# Patient Record
Sex: Male | Born: 1940 | Race: White | Hispanic: No | Marital: Married | Smoking: Current every day smoker
Health system: Southern US, Community
[De-identification: ages and names within clinical notes are randomized; demographics above are authoritative.]

## PROBLEM LIST (undated history)

## (undated) DIAGNOSIS — E119 Type 2 diabetes mellitus without complications: Secondary | ICD-10-CM

## (undated) DIAGNOSIS — H269 Unspecified cataract: Secondary | ICD-10-CM

## (undated) DIAGNOSIS — E079 Disorder of thyroid, unspecified: Secondary | ICD-10-CM

## (undated) DIAGNOSIS — T7840XA Allergy, unspecified, initial encounter: Secondary | ICD-10-CM

## (undated) HISTORY — PX: VASECTOMY: SHX75

## (undated) HISTORY — PX: EYE SURGERY: SHX253

## (undated) HISTORY — PX: JOINT REPLACEMENT: SHX530

## (undated) HISTORY — DX: Type 2 diabetes mellitus without complications: E11.9

## (undated) HISTORY — DX: Unspecified cataract: H26.9

## (undated) HISTORY — DX: Allergy, unspecified, initial encounter: T78.40XA

## (undated) HISTORY — DX: Disorder of thyroid, unspecified: E07.9

## (undated) HISTORY — PX: FRACTURE SURGERY: SHX138

---

## 1997-08-16 ENCOUNTER — Ambulatory Visit (HOSPITAL_COMMUNITY): Admission: RE | Admit: 1997-08-16 | Discharge: 1997-08-16 | Payer: Self-pay | Admitting: Endocrinology

## 1998-10-02 ENCOUNTER — Ambulatory Visit (HOSPITAL_COMMUNITY): Admission: RE | Admit: 1998-10-02 | Discharge: 1998-10-02 | Payer: Self-pay | Admitting: *Deleted

## 2000-07-22 ENCOUNTER — Encounter: Admission: RE | Admit: 2000-07-22 | Discharge: 2000-10-20 | Payer: Self-pay | Admitting: Endocrinology

## 2001-11-15 ENCOUNTER — Encounter (HOSPITAL_COMMUNITY): Admission: RE | Admit: 2001-11-15 | Discharge: 2002-02-13 | Payer: Self-pay | Admitting: Neurology

## 2001-12-15 ENCOUNTER — Ambulatory Visit (HOSPITAL_COMMUNITY): Admission: RE | Admit: 2001-12-15 | Discharge: 2001-12-15 | Payer: Self-pay | Admitting: Neurology

## 2002-02-14 ENCOUNTER — Encounter (HOSPITAL_COMMUNITY): Admission: RE | Admit: 2002-02-14 | Discharge: 2002-05-15 | Payer: Self-pay | Admitting: Neurology

## 2002-12-09 ENCOUNTER — Encounter: Payer: Self-pay | Admitting: Orthopedic Surgery

## 2002-12-14 ENCOUNTER — Encounter (INDEPENDENT_AMBULATORY_CARE_PROVIDER_SITE_OTHER): Payer: Self-pay | Admitting: *Deleted

## 2002-12-14 ENCOUNTER — Inpatient Hospital Stay (HOSPITAL_COMMUNITY): Admission: RE | Admit: 2002-12-14 | Discharge: 2002-12-18 | Payer: Self-pay | Admitting: Orthopedic Surgery

## 2002-12-14 ENCOUNTER — Encounter: Payer: Self-pay | Admitting: Orthopedic Surgery

## 2002-12-17 ENCOUNTER — Encounter: Payer: Self-pay | Admitting: Orthopedic Surgery

## 2003-09-06 ENCOUNTER — Encounter (INDEPENDENT_AMBULATORY_CARE_PROVIDER_SITE_OTHER): Payer: Self-pay | Admitting: Specialist

## 2003-09-06 ENCOUNTER — Ambulatory Visit (HOSPITAL_COMMUNITY): Admission: RE | Admit: 2003-09-06 | Discharge: 2003-09-06 | Payer: Self-pay | Admitting: *Deleted

## 2005-11-11 ENCOUNTER — Encounter: Admission: RE | Admit: 2005-11-11 | Discharge: 2005-11-11 | Payer: Self-pay | Admitting: Family Medicine

## 2006-03-02 ENCOUNTER — Encounter: Admission: RE | Admit: 2006-03-02 | Discharge: 2006-03-02 | Payer: Self-pay | Admitting: Endocrinology

## 2006-06-25 ENCOUNTER — Emergency Department (HOSPITAL_COMMUNITY): Admission: EM | Admit: 2006-06-25 | Discharge: 2006-06-26 | Payer: Self-pay | Admitting: Emergency Medicine

## 2006-08-31 ENCOUNTER — Encounter: Admission: RE | Admit: 2006-08-31 | Discharge: 2006-08-31 | Payer: Self-pay

## 2007-12-28 ENCOUNTER — Encounter: Admission: RE | Admit: 2007-12-28 | Discharge: 2007-12-28 | Payer: Self-pay | Admitting: Gastroenterology

## 2009-02-16 ENCOUNTER — Encounter: Admission: RE | Admit: 2009-02-16 | Discharge: 2009-02-16 | Payer: Self-pay | Admitting: Orthopedic Surgery

## 2009-04-03 ENCOUNTER — Inpatient Hospital Stay (HOSPITAL_COMMUNITY): Admission: RE | Admit: 2009-04-03 | Discharge: 2009-04-06 | Payer: Self-pay | Admitting: Orthopedic Surgery

## 2009-04-08 ENCOUNTER — Inpatient Hospital Stay (HOSPITAL_COMMUNITY): Admission: EM | Admit: 2009-04-08 | Discharge: 2009-04-09 | Payer: Self-pay | Admitting: Emergency Medicine

## 2010-01-03 DEATH — deceased

## 2010-05-25 ENCOUNTER — Encounter: Payer: Self-pay | Admitting: Endocrinology

## 2010-08-06 LAB — BASIC METABOLIC PANEL
BUN: 12 mg/dL (ref 6–23)
BUN: 17 mg/dL (ref 6–23)
BUN: 23 mg/dL (ref 6–23)
CO2: 28 mEq/L (ref 19–32)
Calcium: 8.2 mg/dL — ABNORMAL LOW (ref 8.4–10.5)
Calcium: 8.5 mg/dL (ref 8.4–10.5)
Chloride: 100 mEq/L (ref 96–112)
Chloride: 102 mEq/L (ref 96–112)
Chloride: 102 mEq/L (ref 96–112)
Creatinine, Ser: 1.01 mg/dL (ref 0.4–1.5)
Creatinine, Ser: 1.37 mg/dL (ref 0.4–1.5)
GFR calc Af Amer: >60 mL/min (ref >60)
GFR calc non Af Amer: 52 mL/min — ABNORMAL LOW (ref >60)
GFR calc non Af Amer: >60 mL/min (ref >60)
Glucose, Bld: 141 mg/dL — ABNORMAL HIGH (ref 70–99)
Glucose, Bld: 146 mg/dL — ABNORMAL HIGH (ref 70–99)
Potassium: 3.8 mEq/L (ref 3.5–5.1)
Sodium: 136 mEq/L (ref 135–145)
Sodium: 136 mEq/L (ref 135–145)

## 2010-08-06 LAB — CBC
HCT: 25.3 % — ABNORMAL LOW (ref 39.0–52.0)
HCT: 35.4 % — ABNORMAL LOW (ref 39.0–52.0)
Hemoglobin: 8.9 g/dL — ABNORMAL LOW (ref 13.0–17.0)
MCV: 94.1 fL (ref 78.0–100.0)
MCV: 94.2 fL (ref 78.0–100.0)
Platelets: 225 10*3/uL (ref 150–400)
RBC: 2.69 MIL/uL — ABNORMAL LOW (ref 4.22–5.81)
RBC: 3.76 MIL/uL — ABNORMAL LOW (ref 4.22–5.81)
RDW: 13.2 % (ref 11.5–15.5)
WBC: 6.6 10*3/uL (ref 4.0–10.5)

## 2010-08-06 LAB — GLUCOSE, CAPILLARY
Glucose-Capillary: 118 mg/dL — ABNORMAL HIGH (ref 70–99)
Glucose-Capillary: 121 mg/dL — ABNORMAL HIGH (ref 70–99)
Glucose-Capillary: 126 mg/dL — ABNORMAL HIGH (ref 70–99)
Glucose-Capillary: 134 mg/dL — ABNORMAL HIGH (ref 70–99)
Glucose-Capillary: 136 mg/dL — ABNORMAL HIGH (ref 70–99)
Glucose-Capillary: 147 mg/dL — ABNORMAL HIGH (ref 70–99)

## 2010-08-06 LAB — URINALYSIS, MICROSCOPIC ONLY
Ketones, ur: NEGATIVE mg/dL
Leukocytes, UA: NEGATIVE
Nitrite: NEGATIVE
Protein, ur: NEGATIVE mg/dL
pH: 6 (ref 5.0–8.0)

## 2010-08-07 LAB — COMPREHENSIVE METABOLIC PANEL
BUN: 16 mg/dL (ref 6–23)
CO2: 24 mEq/L (ref 19–32)
Chloride: 109 mEq/L (ref 96–112)
Creatinine, Ser: 1.01 mg/dL (ref 0.4–1.5)
GFR calc non Af Amer: >60 mL/min (ref >60)
Total Bilirubin: 0.6 mg/dL (ref 0.3–1.2)

## 2010-08-07 LAB — APTT: aPTT: 31 seconds (ref 24–37)

## 2010-08-07 LAB — GLUCOSE, CAPILLARY: Glucose-Capillary: 106 mg/dL — ABNORMAL HIGH (ref 70–99)

## 2010-08-07 LAB — CBC
HCT: 43.7 % (ref 39.0–52.0)
MCV: 93.8 fL (ref 78.0–100.0)
RBC: 4.66 MIL/uL (ref 4.22–5.81)
WBC: 7.7 10*3/uL (ref 4.0–10.5)

## 2010-08-07 LAB — DIFFERENTIAL
Basophils Absolute: 0 10*3/uL (ref 0.0–0.1)
Eosinophils Relative: 1 % (ref 0–5)
Lymphocytes Relative: 37 % (ref 12–46)
Neutro Abs: 4.3 10*3/uL (ref 1.7–7.7)

## 2010-08-07 LAB — PROTIME-INR: Prothrombin Time: 13.2 seconds (ref 11.6–15.2)

## 2010-09-20 NOTE — Discharge Summary (Signed)
   NAME:  Gabriel Evans, Gabriel Evans                         ACCOUNT NO.:  [id number]   MEDICAL RECORD NO.:  [id number]                   PATIENT TYPE:  INP   LOCATION:  5031                                 FACILITY:  [facility]   PHYSICIAN:  Nadara Mustard, M.D.                DATE OF BIRTH:  02-11-1941   DATE OF ADMISSION:  12/14/2002  DATE OF DISCHARGE:  12/18/2002                                 DISCHARGE SUMMARY   DISCHARGE DIAGNOSIS:  Avascular necrosis, right hip.   POSTOPERATIVE DIAGNOSIS:  Avascular necrosis, right hip.   PROCEDURE:  Right total hip arthroplasty.   DISPOSITION:  Discharged to home in stable condition with home health PT and  Coumadin for DVT prophylaxis. Follow up in the office in two weeks.   HISTORY OF PRESENT ILLNESS:  The patient is a 70 year old gentleman with  avascular necrosis collapse of the right hip. The patient has failed  conservative care and presented at this time for surgical intervention. The  patient's hospital course was essentially unremarkable. He underwent a right  total hip arthroplasty on August 11 with a #4 femur, 64-mm acetabulum, +0  neck, 36-mm head. The head was sent to pathology to ensure that this was  consistent with avascular necrosis. The patient was started on physical  therapy with progressive ambulation, weight bearing as tolerated. He was  placed on Keflex for infection prophylaxis and Coumadin for DVT prophylaxis.  The patient progressed well with physical therapy, and he was discharged to  home in stable condition on August 15 and will follow up in the office in  two weeks with home health PT.                                                  Nadara Mustard, M.D.    MVD/MEDQ  D:  01/24/2003  T:  01/25/2003  Job:  [id number]

## 2010-09-20 NOTE — H&P (Signed)
NAME:  Gabriel Evans, Gabriel Evans                         ACCOUNT NO.:  [id number]   MEDICAL RECORD NO.:  [id number]                   PATIENT TYPE:  INP   LOCATION:  2550                                 FACILITY:  [facility]   PHYSICIAN:  Nadara Mustard, M.D.                DATE OF BIRTH:  [birth year]   DATE OF ADMISSION:  12/14/2002  DATE OF DISCHARGE:                                HISTORY & PHYSICAL   HISTORY OF PRESENT ILLNESS:  The patient is a 70 year old gentleman who  developed bilateral hip pain right worse than left with MRI scan consistent  with avascular necrosis of the hips.  The patient states he is unable to  perform activities of daily living due to his hip pain. He has decreased  range of motion and presents at this time for a total hip arthroplasty.   ALLERGIES:  The patient states that PREDNISONE causes AGM, but otherwise no  other allergies.   MEDICATIONS:  Please see the attached medication sheet which included Mobic  15 mg p.o. q.a.m., Effexor XR 150 mg q.a.m. and 75 mg p.o. q.p.m., Avalide  150/12.5 p.o. q.a.m., Synthroid 10 mcg p.o. q.a.m., Glucovance 2.5/500 p.o.  q.a.m., Lactose 15 mg p.o. q.p.m., Ambien 10 mg p.o. q.h.s., and aspirin and  multivitamins.   PAST MEDICAL HISTORY:  Significant for vas deferens surgery in 1986, skull  fracture in 1962 for which he underwent tibial pin traction for a femur  fracture, surgery for mandible fracture, and a skull fracture.   SOCIAL HISTORY:  Does smoke pipes or cigars occasionally.  Alcohol  occasionally.  He is married.   FAMILY HISTORY:  Positive for hypertension and diabetes.   REVIEW OF SYSTEMS:  Positive for arthritis, skin cancer, type 2 diabetes,  and hypertension.   PHYSICAL EXAMINATION:  VITAL SIGNS: Temperature 96.8, pulse 82, respiratory  rate 20, blood pressure 120/66.  GENERAL: He is in no acute distress.  LUNGS:  Clear to auscultation.  HEART:  Regular rate and rhythm.  NECK:  Supple, no bruits.  EXTREMITIES:  Right lower extremity has internal rotation of 20 degrees,  external rotation of 45 degrees.   Radiograph shows avascular necrosis of both hips and MRI scan confirms  avascular necrosis.  The patient did have a previous femur fracture and  radiograph shows a distal healed femur fracture, but this is in the area of  the total hip arthroplasty.   ASSESSMENT:  Avascular necrosis of bilateral hips, right worse than left.    PLAN:  The patient wishes to proceed with total hip arthroplasty at this  time. The risks and benefits were discussed including infection, nerve  vascular injury, persistent pain, dislocation, DVT, and pulmonary embolism.  The patient understands and wishes to proceed at this time.  Nadara Mustard, M.D.    MVD/MEDQ  D:  12/14/2002  T:  12/14/2002  Job:  [id number]

## 2010-09-20 NOTE — Op Note (Signed)
NAME:  Gabriel Evans, Gabriel Evans                         ACCOUNT NO.:  [id number]   MEDICAL RECORD NO.:  [id number]                   PATIENT TYPE:  INP   LOCATION:  2550                                 FACILITY:  [facility]   PHYSICIAN:  Nadara Mustard, M.D.                DATE OF BIRTH:  06-12-1940   DATE OF PROCEDURE:  12/14/2002  DATE OF DISCHARGE:                                 OPERATIVE REPORT   PREOPERATIVE DIAGNOSIS:  Avascular necrosis, right hip.   POSTOPERATIVE DIAGNOSIS:  Avascular necrosis, right hip.   PROCEDURE:  Right total hip arthroplasty with ceramic on ceramic [employer]  components, femur #4, Accolade stem, acetabulum 64 mg, neck +0, head 36 mm.   SURGEON:  Nadara Mustard, M.D.   ANESTHESIA:  General endotracheal.   ESTIMATED BLOOD LOSS:  250 mL.   ANTIBIOTICS:  1 g Kefzol.   Head sent to pathology for evaluation for avascular necrosis.   The risks and benefits were discussed.  The patient states he understands  and wishes to proceed at this time.   DESCRIPTION OF PROCEDURE:  The patient was brought to OR room 5 and  underwent a general anesthetic.  After an adequate level of anesthesia  obtained, the patient was placed in the left lateral decubitus position with  the right side up and his right lower extremity was prepped using Duraprep,  draped into a sterile field, and an [place] was used to cover all exposed  skin.  A posterolateral incision was made.  This was carried down to the  tensor fascia lata.  A Charnley retractor was placed.  The piriformis and  short external rotators were tagged, cut, and retracted.  The capsule was  teed, tagged, and retracted.  The hip was dislocated and the femoral neck  cut was made 1 cm proximal to the calcar.  Attention was first focused on  the acetabulum.  The acetabulum reamed to a 60 mm and the 60 mm acetabulum  was trialled, and this had a good, stable fit.  The final acetabular  prosthesis was then inserted and due to  fractures of the bone spurs, this no  longer had a stable fit.  This was then removed, the bone spurs were  removed, and this was then reamed to a 64 mm.  Again this was tried and had  a good stable fit with a trial, and the 64 mm acetabulum was then inserted.  The trial liner was inserted.  The femur was then sequentially broached to a  #4 Accolade stem.  This was trialled with a trial head and trial liner, and  the patient had full flexion of 100 degrees, had full adduction with  internal rotation of 45 degrees, and the hip was stable.  The patient also  had full external rotation and extension.  The trial components were  removed.  The acetabulum liner was removed, the centralizer  spacer was  placed.  This was again irrigated, and the ceramic acetabular liner was  placed.  The Accolade stem #4 was then inserted and the 36 mm head was  attached.  This was reduced.  Again the hip was placed through a full range  of motion and had no instability.  The wounds were irrigated, the  instruments were removed.  The piriformis, capsule, and short external  rotators were reapproximated.  The tensor fascia lata was closed using #1  Vicryl, the deep subcu  and hip flexure were closed using 2-0 Vicryl, the skin was closed using  Proximate staples.  The wound was covered with Adaptic, orthopedic sponges,  and Ioban tape.  The patient was transferred supine, a knee immobilizer was  placed.  He was extubated and taken to PACU in stable condition.                                               Nadara Mustard, M.D.    MVD/MEDQ  D:  12/14/2002  T:  12/14/2002  Job:  [id number]

## 2010-09-20 NOTE — Op Note (Signed)
NAME:  Gabriel Evans, Gabriel Evans                         ACCOUNT NO.:  [id number]   MEDICAL RECORD NO.:  [id number]                   PATIENT TYPE:  AMB   LOCATION:  ENDO                                 [facility]ACILITY[facility]  [facility]   PHYSICIAN:  Georgiana Spinner, M.D.                 DATE OF BIRTH:  1940-12-14   DATE OF PROCEDURE:  09/06/2003  DATE OF DISCHARGE:                                 OPERATIVE REPORT   PROCEDURE:  Colonoscopy with biopsy.   INDICATIONS:  Colon polyp.   ANESTHESIA:  1. Demerol 70 mg.  2. Versed 7 mg.   DESCRIPTION OF PROCEDURE:  With patient mildly sedated in the left lateral  decubitus position, the Olympus videoscopic colonoscope was inserted into  the rectum and passed under direct vision to the cecum, identified by  ileocecal valve and appendiceal orifice, both of which were photographed.  In the cecum was a polyp that was also photographed and removed using hot  biopsy forceps technique, setting of 20-20 blended current.  The endoscope  was then withdrawn, taking circumferential views of the remaining colonic  mucosa, stopping only in the rectum which appeared normal in direct and  retroflexed view.  The endoscope was straightened, withdrawn.  The patient's  vital signs, pulse oximeter remained stable.  The patient tolerated the  procedure well without apparent complications.   FINDINGS:  Colon polyp of cecum.  Await biopsy report.  The patient will  call me for results and follow up with me as an outpatient.                                               Georgiana Spinner, M.D.    GMO/MEDQ  D:  09/06/2003  T:  09/06/2003  Job:  [id number]

## 2011-05-14 DIAGNOSIS — D485 Neoplasm of uncertain behavior of skin: Secondary | ICD-10-CM | POA: Diagnosis not present

## 2011-05-14 DIAGNOSIS — L819 Disorder of pigmentation, unspecified: Secondary | ICD-10-CM | POA: Diagnosis not present

## 2011-05-14 DIAGNOSIS — R238 Other skin changes: Secondary | ICD-10-CM | POA: Diagnosis not present

## 2011-05-14 DIAGNOSIS — L905 Scar conditions and fibrosis of skin: Secondary | ICD-10-CM | POA: Diagnosis not present

## 2011-05-14 DIAGNOSIS — L570 Actinic keratosis: Secondary | ICD-10-CM | POA: Diagnosis not present

## 2011-07-09 DIAGNOSIS — E789 Disorder of lipoprotein metabolism, unspecified: Secondary | ICD-10-CM | POA: Diagnosis not present

## 2011-07-09 DIAGNOSIS — E119 Type 2 diabetes mellitus without complications: Secondary | ICD-10-CM | POA: Diagnosis not present

## 2011-07-16 DIAGNOSIS — E0789 Other specified disorders of thyroid: Secondary | ICD-10-CM | POA: Diagnosis not present

## 2011-07-16 DIAGNOSIS — E559 Vitamin D deficiency, unspecified: Secondary | ICD-10-CM | POA: Diagnosis not present

## 2011-07-16 DIAGNOSIS — E789 Disorder of lipoprotein metabolism, unspecified: Secondary | ICD-10-CM | POA: Diagnosis not present

## 2011-07-16 DIAGNOSIS — E119 Type 2 diabetes mellitus without complications: Secondary | ICD-10-CM | POA: Diagnosis not present

## 2011-07-16 DIAGNOSIS — N059 Unspecified nephritic syndrome with unspecified morphologic changes: Secondary | ICD-10-CM | POA: Diagnosis not present

## 2011-08-11 DIAGNOSIS — D485 Neoplasm of uncertain behavior of skin: Secondary | ICD-10-CM | POA: Diagnosis not present

## 2011-08-11 DIAGNOSIS — L570 Actinic keratosis: Secondary | ICD-10-CM | POA: Diagnosis not present

## 2011-11-26 ENCOUNTER — Ambulatory Visit: Payer: Medicare Other

## 2011-11-26 ENCOUNTER — Ambulatory Visit (INDEPENDENT_AMBULATORY_CARE_PROVIDER_SITE_OTHER): Payer: Medicare Other | Admitting: Family Medicine

## 2011-11-26 VITALS — BP 122/64 | HR 76 | Temp 97.4°F | Resp 16 | Ht 70.0 in | Wt 198.0 lb

## 2011-11-26 DIAGNOSIS — R0781 Pleurodynia: Secondary | ICD-10-CM

## 2011-11-26 DIAGNOSIS — R079 Chest pain, unspecified: Secondary | ICD-10-CM
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
## 2011-12-01 DIAGNOSIS — S2239XA Fracture of one rib, unspecified side, initial encounter for closed fracture: Secondary | ICD-10-CM | POA: Diagnosis not present

## 2011-12-09 DIAGNOSIS — L570 Actinic keratosis: Secondary | ICD-10-CM | POA: Diagnosis not present

## 2012-01-08 DIAGNOSIS — E0789 Other specified disorders of thyroid: Secondary | ICD-10-CM | POA: Diagnosis not present

## 2012-01-08 DIAGNOSIS — E789 Disorder of lipoprotein metabolism, unspecified: Secondary | ICD-10-CM | POA: Diagnosis not present

## 2012-01-08 DIAGNOSIS — E119 Type 2 diabetes mellitus without complications: Secondary | ICD-10-CM | POA: Diagnosis not present

## 2012-01-08 DIAGNOSIS — Z125 Encounter for screening for malignant neoplasm of prostate: Secondary | ICD-10-CM | POA: Diagnosis not present

## 2012-01-14 DIAGNOSIS — E119 Type 2 diabetes mellitus without complications: Secondary | ICD-10-CM | POA: Diagnosis not present

## 2012-01-14 DIAGNOSIS — R079 Chest pain, unspecified: Secondary | ICD-10-CM | POA: Diagnosis not present

## 2012-01-14 DIAGNOSIS — G589 Mononeuropathy, unspecified: Secondary | ICD-10-CM | POA: Diagnosis not present

## 2012-01-14 DIAGNOSIS — E789 Disorder of lipoprotein metabolism, unspecified: Secondary | ICD-10-CM | POA: Diagnosis not present

## 2012-02-06 DIAGNOSIS — Z23 Encounter for immunization: Secondary | ICD-10-CM | POA: Diagnosis not present

## 2012-03-04 ENCOUNTER — Other Ambulatory Visit: Payer: Self-pay | Admitting: Gastroenterology

## 2012-03-04 DIAGNOSIS — D129 Benign neoplasm of anus and anal canal: Secondary | ICD-10-CM | POA: Diagnosis not present

## 2012-03-04 DIAGNOSIS — K648 Other hemorrhoids: Secondary | ICD-10-CM | POA: Diagnosis not present

## 2012-03-04 DIAGNOSIS — Z86010 Personal history of colonic polyps: Secondary | ICD-10-CM | POA: Diagnosis not present

## 2012-03-04 DIAGNOSIS — Z09 Encounter for follow-up examination after completed treatment for conditions other than malignant neoplasm: Secondary | ICD-10-CM | POA: Diagnosis not present

## 2012-04-05 DIAGNOSIS — D239 Other benign neoplasm of skin, unspecified: Secondary | ICD-10-CM | POA: Diagnosis not present

## 2012-04-05 DIAGNOSIS — L570 Actinic keratosis: Secondary | ICD-10-CM | POA: Diagnosis not present

## 2012-04-05 DIAGNOSIS — Z85828 Personal history of other malignant neoplasm of skin: Secondary | ICD-10-CM | POA: Diagnosis not present

## 2012-04-05 DIAGNOSIS — L821 Other seborrheic keratosis: Secondary | ICD-10-CM | POA: Diagnosis not present

## 2012-04-08 DIAGNOSIS — E119 Type 2 diabetes mellitus without complications: Secondary | ICD-10-CM | POA: Diagnosis not present

## 2012-04-08 DIAGNOSIS — H04129 Dry eye syndrome of unspecified lacrimal gland: Secondary | ICD-10-CM | POA: Diagnosis not present

## 2012-04-08 DIAGNOSIS — H25019 Cortical age-related cataract, unspecified eye: Secondary | ICD-10-CM | POA: Diagnosis not present

## 2012-04-08 DIAGNOSIS — Z961 Presence of intraocular lens: Secondary | ICD-10-CM | POA: Diagnosis not present

## 2012-07-07 DIAGNOSIS — E789 Disorder of lipoprotein metabolism, unspecified: Secondary | ICD-10-CM | POA: Diagnosis not present

## 2012-07-07 DIAGNOSIS — E119 Type 2 diabetes mellitus without complications: Secondary | ICD-10-CM | POA: Diagnosis not present

## 2012-07-14 DIAGNOSIS — Z79899 Other long term (current) drug therapy: Secondary | ICD-10-CM | POA: Diagnosis not present

## 2012-07-14 DIAGNOSIS — E119 Type 2 diabetes mellitus without complications: Secondary | ICD-10-CM | POA: Diagnosis not present

## 2012-07-14 DIAGNOSIS — R002 Palpitations: Secondary | ICD-10-CM | POA: Diagnosis not present

## 2012-07-14 DIAGNOSIS — G589 Mononeuropathy, unspecified: Secondary | ICD-10-CM | POA: Diagnosis not present

## 2012-07-14 DIAGNOSIS — R279 Unspecified lack of coordination: Secondary | ICD-10-CM | POA: Diagnosis not present

## 2012-08-03 DIAGNOSIS — L259 Unspecified contact dermatitis, unspecified cause: Secondary | ICD-10-CM | POA: Diagnosis not present

## 2012-08-03 DIAGNOSIS — L570 Actinic keratosis: Secondary | ICD-10-CM | POA: Diagnosis not present

## 2012-08-03 DIAGNOSIS — Z85828 Personal history of other malignant neoplasm of skin: Secondary | ICD-10-CM | POA: Diagnosis not present

## 2012-11-08 DIAGNOSIS — E789 Disorder of lipoprotein metabolism, unspecified: Secondary | ICD-10-CM | POA: Diagnosis not present

## 2012-11-08 DIAGNOSIS — E0789 Other specified disorders of thyroid: Secondary | ICD-10-CM | POA: Diagnosis not present

## 2012-11-08 DIAGNOSIS — G589 Mononeuropathy, unspecified: Secondary | ICD-10-CM | POA: Diagnosis not present

## 2012-11-08 DIAGNOSIS — E119 Type 2 diabetes mellitus without complications: Secondary | ICD-10-CM | POA: Diagnosis not present

## 2013-01-07 DIAGNOSIS — H2589 Other age-related cataract: Secondary | ICD-10-CM | POA: Diagnosis not present

## 2013-01-07 DIAGNOSIS — E119 Type 2 diabetes mellitus without complications: Secondary | ICD-10-CM | POA: Diagnosis not present

## 2013-01-11 DIAGNOSIS — Z125 Encounter for screening for malignant neoplasm of prostate: Secondary | ICD-10-CM | POA: Diagnosis not present

## 2013-01-11 DIAGNOSIS — E119 Type 2 diabetes mellitus without complications: Secondary | ICD-10-CM | POA: Diagnosis not present

## 2013-01-11 DIAGNOSIS — E789 Disorder of lipoprotein metabolism, unspecified: Secondary | ICD-10-CM | POA: Diagnosis not present

## 2013-01-11 DIAGNOSIS — E0789 Other specified disorders of thyroid: Secondary | ICD-10-CM | POA: Diagnosis not present

## 2013-01-11 DIAGNOSIS — Z79899 Other long term (current) drug therapy: Secondary | ICD-10-CM | POA: Diagnosis not present

## 2013-01-18 DIAGNOSIS — D649 Anemia, unspecified: Secondary | ICD-10-CM | POA: Diagnosis not present

## 2013-01-18 DIAGNOSIS — E0789 Other specified disorders of thyroid: Secondary | ICD-10-CM | POA: Diagnosis not present

## 2013-01-18 DIAGNOSIS — E789 Disorder of lipoprotein metabolism, unspecified: Secondary | ICD-10-CM | POA: Diagnosis not present

## 2013-01-18 DIAGNOSIS — E119 Type 2 diabetes mellitus without complications: Secondary | ICD-10-CM | POA: Diagnosis not present

## 2013-03-22 DIAGNOSIS — E119 Type 2 diabetes mellitus without complications: Secondary | ICD-10-CM | POA: Diagnosis not present

## 2013-03-22 DIAGNOSIS — D649 Anemia, unspecified: Secondary | ICD-10-CM | POA: Diagnosis not present

## 2013-03-22 DIAGNOSIS — H53149 Visual discomfort, unspecified: Secondary | ICD-10-CM | POA: Diagnosis not present

## 2013-03-22 DIAGNOSIS — H25019 Cortical age-related cataract, unspecified eye: Secondary | ICD-10-CM | POA: Diagnosis not present

## 2013-04-25 DIAGNOSIS — L570 Actinic keratosis: Secondary | ICD-10-CM | POA: Diagnosis not present

## 2013-04-25 DIAGNOSIS — D239 Other benign neoplasm of skin, unspecified: Secondary | ICD-10-CM | POA: Diagnosis not present

## 2013-04-25 DIAGNOSIS — L821 Other seborrheic keratosis: Secondary | ICD-10-CM | POA: Diagnosis not present

## 2013-04-25 DIAGNOSIS — Z85828 Personal history of other malignant neoplasm of skin: Secondary | ICD-10-CM | POA: Diagnosis not present

## 2013-05-17 DIAGNOSIS — Z23 Encounter for immunization: Secondary | ICD-10-CM | POA: Diagnosis not present

## 2013-07-19 DIAGNOSIS — E119 Type 2 diabetes mellitus without complications: Secondary | ICD-10-CM | POA: Diagnosis not present

## 2013-07-19 DIAGNOSIS — E789 Disorder of lipoprotein metabolism, unspecified: Secondary | ICD-10-CM | POA: Diagnosis not present

## 2013-07-19 DIAGNOSIS — D649 Anemia, unspecified: Secondary | ICD-10-CM | POA: Diagnosis not present

## 2013-07-19 DIAGNOSIS — E0789 Other specified disorders of thyroid: Secondary | ICD-10-CM | POA: Diagnosis not present

## 2013-07-26 DIAGNOSIS — E119 Type 2 diabetes mellitus without complications: Secondary | ICD-10-CM | POA: Diagnosis not present

## 2013-07-26 DIAGNOSIS — E789 Disorder of lipoprotein metabolism, unspecified: Secondary | ICD-10-CM | POA: Diagnosis not present

## 2013-07-26 DIAGNOSIS — G589 Mononeuropathy, unspecified: Secondary | ICD-10-CM | POA: Diagnosis not present

## 2013-07-26 DIAGNOSIS — E0789 Other specified disorders of thyroid: Secondary | ICD-10-CM | POA: Diagnosis not present

## 2013-08-09 DIAGNOSIS — M25559 Pain in unspecified hip: Secondary | ICD-10-CM | POA: Diagnosis not present

## 2013-08-10 DIAGNOSIS — M25559 Pain in unspecified hip: Secondary | ICD-10-CM | POA: Diagnosis not present

## 2013-08-16 DIAGNOSIS — M25559 Pain in unspecified hip: Secondary | ICD-10-CM | POA: Diagnosis not present

## 2013-08-18 DIAGNOSIS — M25559 Pain in unspecified hip: Secondary | ICD-10-CM | POA: Diagnosis not present

## 2013-08-23 DIAGNOSIS — M25559 Pain in unspecified hip: Secondary | ICD-10-CM | POA: Diagnosis not present

## 2013-08-26 DIAGNOSIS — M25559 Pain in unspecified hip: Secondary | ICD-10-CM | POA: Diagnosis not present

## 2013-08-30 DIAGNOSIS — M25559 Pain in unspecified hip: Secondary | ICD-10-CM | POA: Diagnosis not present

## 2013-08-31 ENCOUNTER — Ambulatory Visit (INDEPENDENT_AMBULATORY_CARE_PROVIDER_SITE_OTHER): Payer: Medicare Other | Admitting: Family Medicine

## 2013-08-31 VITALS — BP 138/74 | HR 79 | Temp 97.5°F | Resp 16 | Ht 69.75 in | Wt 201.0 lb

## 2013-08-31 DIAGNOSIS — J301 Allergic rhinitis due to pollen: Secondary | ICD-10-CM

## 2013-08-31 DIAGNOSIS — J0100 Acute maxillary sinusitis, unspecified: Secondary | ICD-10-CM | POA: Diagnosis not present

## 2013-08-31 MED ORDER — IPRATROPIUM BROMIDE 0.03 % NA SOLN: 2.0000 | Freq: Two times a day (BID) | NASAL | Status: AC

## 2013-08-31 MED ORDER — AMOXICILLIN-POT CLAVULANATE 875-125 MG PO TABS
1.0000 | ORAL_TABLET | Freq: Two times a day (BID) | ORAL | Status: DC
Start: 2013-08-31 — End: 2013-10-01
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
## 2013-08-31 NOTE — Patient Instructions (Signed)
1. Continue Zyrtec daily. 2.  Take Mucinex twice daily.  Sinusitis Sinusitis is redness, soreness, and swelling (inflammation) of the paranasal sinuses. Paranasal sinuses are air pockets within the bones of your face (beneath the eyes, the middle of the forehead, or above the eyes). In healthy paranasal sinuses, mucus is able to drain out, and air is able to circulate through them by way of your nose. However, when your paranasal sinuses are inflamed, mucus and air can become trapped. This can allow bacteria and other germs to grow and cause infection. Sinusitis can develop quickly and last only a short time (acute) or continue over a long period (chronic). Sinusitis that lasts for more than 12 weeks is considered chronic.  CAUSES  Causes of sinusitis include:  Allergies.  Structural abnormalities, such as displacement of the cartilage that separates your nostrils (deviated septum), which can decrease the air flow through your nose and sinuses and affect sinus drainage.  Functional abnormalities, such as when the small hairs (cilia) that line your sinuses and help remove mucus do not work properly or are not present. SYMPTOMS  Symptoms of acute and chronic sinusitis are the same. The primary symptoms are pain and pressure around the affected sinuses. Other symptoms include:  Upper toothache.  Earache.  Headache.  Bad breath.  Decreased sense of smell and taste.  A cough, which worsens when you are lying flat.  Fatigue.  Fever.  Thick drainage from your nose, which often is green and may contain pus (purulent).  Swelling and warmth over the affected sinuses. DIAGNOSIS  Your caregiver will perform a physical exam. During the exam, your caregiver may:  Look in your nose for signs of abnormal growths in your nostrils (nasal polyps).  Tap over the affected sinus to check for signs of infection.  View the inside of your sinuses (endoscopy) with a special imaging device with a  light attached (endoscope), which is inserted into your sinuses. If your caregiver suspects that you have chronic sinusitis, one or more of the following tests may be recommended:  Allergy tests.  Nasal culture A sample of mucus is taken from your nose and sent to a lab and screened for bacteria.  Nasal cytology A sample of mucus is taken from your nose and examined by your caregiver to determine if your sinusitis is related to an allergy. TREATMENT  Most cases of acute sinusitis are related to a viral infection and will resolve on their own within 10 days. Sometimes medicines are prescribed to help relieve symptoms (pain medicine, decongestants, nasal steroid sprays, or saline sprays).  However, for sinusitis related to a bacterial infection, your caregiver will prescribe antibiotic medicines. These are medicines that will help kill the bacteria causing the infection.  Rarely, sinusitis is caused by a fungal infection. In theses cases, your caregiver will prescribe antifungal medicine. For some cases of chronic sinusitis, surgery is needed. Generally, these are cases in which sinusitis recurs more than 3 times per year, despite other treatments. HOME CARE INSTRUCTIONS   Drink plenty of water. Water helps thin the mucus so your sinuses can drain more easily.  Use a humidifier.  Inhale steam 3 to 4 times a day (for example, sit in the bathroom with the shower running).  Apply a warm, moist washcloth to your face 3 to 4 times a day, or as directed by your caregiver.  Use saline nasal sprays to help moisten and clean your sinuses.  Take over-the-counter or prescription medicines for pain, discomfort,  or fever only as directed by your caregiver. SEEK IMMEDIATE MEDICAL CARE IF:  You have increasing pain or severe headaches.  You have nausea, vomiting, or drowsiness.  You have swelling around your face.  You have vision problems.  You have a stiff neck.  You have difficulty  breathing. MAKE SURE YOU:   Understand these instructions.  Will watch your condition.  Will get help right away if you are not doing well or get worse. Document Released: 04/21/2005 Document Revised: 07/14/2011 Document Reviewed: 05/06/2011 [facility] Patient Information 2014 [place], [place].

## 2013-09-01 DIAGNOSIS — M25559 Pain in unspecified hip: Secondary | ICD-10-CM | POA: Diagnosis not present

## 2013-09-06 DIAGNOSIS — M25559 Pain in unspecified hip: Secondary | ICD-10-CM | POA: Diagnosis not present

## 2013-09-08 DIAGNOSIS — M25559 Pain in unspecified hip: Secondary | ICD-10-CM | POA: Diagnosis not present

## 2013-09-15 DIAGNOSIS — R262 Difficulty in walking, not elsewhere classified: Secondary | ICD-10-CM | POA: Diagnosis not present

## 2013-09-15 DIAGNOSIS — M25559 Pain in unspecified hip: Secondary | ICD-10-CM | POA: Diagnosis not present

## 2013-09-16 DIAGNOSIS — M25559 Pain in unspecified hip: Secondary | ICD-10-CM | POA: Diagnosis not present

## 2013-09-16 DIAGNOSIS — R262 Difficulty in walking, not elsewhere classified: Secondary | ICD-10-CM | POA: Diagnosis not present

## 2013-09-19 DIAGNOSIS — H2510 Age-related nuclear cataract, unspecified eye: Secondary | ICD-10-CM | POA: Diagnosis not present

## 2013-09-19 DIAGNOSIS — M25559 Pain in unspecified hip: Secondary | ICD-10-CM | POA: Diagnosis not present

## 2013-09-19 DIAGNOSIS — R262 Difficulty in walking, not elsewhere classified: Secondary | ICD-10-CM | POA: Diagnosis not present

## 2013-09-19 DIAGNOSIS — E119 Type 2 diabetes mellitus without complications: Secondary | ICD-10-CM | POA: Diagnosis not present

## 2013-09-21 DIAGNOSIS — R262 Difficulty in walking, not elsewhere classified: Secondary | ICD-10-CM | POA: Diagnosis not present

## 2013-09-21 DIAGNOSIS — M25559 Pain in unspecified hip: Secondary | ICD-10-CM | POA: Diagnosis not present

## 2013-09-27 DIAGNOSIS — R262 Difficulty in walking, not elsewhere classified: Secondary | ICD-10-CM | POA: Diagnosis not present

## 2013-09-27 DIAGNOSIS — M25559 Pain in unspecified hip: Secondary | ICD-10-CM | POA: Diagnosis not present

## 2013-09-28 DIAGNOSIS — M25559 Pain in unspecified hip: Secondary | ICD-10-CM | POA: Diagnosis not present

## 2013-09-28 DIAGNOSIS — M5137 Other intervertebral disc degeneration, lumbosacral region: Secondary | ICD-10-CM | POA: Diagnosis not present

## 2013-09-29 DIAGNOSIS — M25559 Pain in unspecified hip: Secondary | ICD-10-CM | POA: Diagnosis not present

## 2013-09-29 DIAGNOSIS — R262 Difficulty in walking, not elsewhere classified: Secondary | ICD-10-CM | POA: Diagnosis not present

## 2013-10-01 ENCOUNTER — Ambulatory Visit (INDEPENDENT_AMBULATORY_CARE_PROVIDER_SITE_OTHER): Payer: Medicare Other | Admitting: Family Medicine

## 2013-10-01 VITALS — BP 138/72 | HR 88 | Temp 97.9°F | Resp 16 | Ht 70.5 in | Wt 203.8 lb

## 2013-10-01 DIAGNOSIS — Z23 Encounter for immunization: Secondary | ICD-10-CM

## 2013-10-01 DIAGNOSIS — S81809A Unspecified open wound, unspecified lower leg, initial encounter: Secondary | ICD-10-CM

## 2013-10-01 DIAGNOSIS — E119 Type 2 diabetes mellitus without complications: Secondary | ICD-10-CM | POA: Diagnosis not present

## 2013-10-01 DIAGNOSIS — S81811A Laceration without foreign body, right lower leg, initial encounter: Secondary | ICD-10-CM

## 2013-10-01 NOTE — Progress Notes (Signed)
Subjective: Patient bumped his leg when he was helping move his mother into an assistant living facility. It caused a lot of bleeding. He has a small laceration was Steri-Stripped by the nurse at the facility. His happened today. His tetanus shot is not up-to-date, approximately 73 years old.  Objective: Right shin has a 2 cm laceration across the shin. It is bleeding. There is a small abrasion just distal to that.  Assessment: Laceration chin  Plan: Suture repair TDap

## 2013-10-01 NOTE — Progress Notes (Signed)
Verbal consent obtained from patient.  Local anesthesia with 3cc Lidocaine 2% without epinephrine.  Wound scrubbed with soap and water and rinsed.  Wound closed with #6 4-0 Prolene SI sutures.  Wound cleansed and dressed.

## 2013-10-01 NOTE — Patient Instructions (Addendum)
WOUND CARE Please return in 7-10 days to have your stitches/staples removed or sooner if you have concerns. Marland Kitchen Keep area clean and dry for 24 hours. Do not remove bandage, if applied. . After 24 hours, remove bandage and wash wound gently with mild soap and warm water. Reapply a new bandage after cleaning wound, if directed. . Continue daily cleansing with soap and water until stitches/staples are removed. . Do not apply any ointments or creams to the wound while stitches/staples are in place, as this may cause delayed healing. . Notify the office if you experience any of the following signs of infection: Swelling, redness, pus drainage, streaking, fever >101.0 F . Notify the office if you experience excessive bleeding that does not stop after 15-20 minutes of constant, firm pressure.    Return sooner if problems or concerns.

## 2013-10-04 DIAGNOSIS — R262 Difficulty in walking, not elsewhere classified: Secondary | ICD-10-CM | POA: Diagnosis not present

## 2013-10-04 DIAGNOSIS — M25559 Pain in unspecified hip: Secondary | ICD-10-CM | POA: Diagnosis not present

## 2013-10-11 ENCOUNTER — Ambulatory Visit (INDEPENDENT_AMBULATORY_CARE_PROVIDER_SITE_OTHER): Payer: Medicare Other | Admitting: Family Medicine

## 2013-10-11 VITALS — BP 126/86 | HR 71 | Temp 98.1°F | Resp 18 | Ht 70.5 in | Wt 203.0 lb

## 2013-10-11 DIAGNOSIS — Z4802 Encounter for removal of sutures: Secondary | ICD-10-CM

## 2013-10-11 NOTE — Progress Notes (Signed)
Subjective:   This chart was scribed for Delman Cheadle, MD, by Neta Ehlers, ED Scribe. This patient's care was started at 4:54 PM.   Patient ID: Gabriel Evans, male    DOB: 1941/02/26, 73 y.o.   MRN: [id number]  Chief Complaint  Patient presents with  . Suture / Staple Removal    on rt leg    HPI  HPI Comments: Gabriel Evans is a 73 y.o. male who presents to [facility] for sutural removal. The sutures were placed at [facility] on 5/30, approximately 10 days ago, for a laceration to his right lower leg which he received when he injured his leg while moving his mother into an assisted living facility.  The pt denies pain associated with the laceration or any problems with the wound as it is healing. He reports a h/o DM and peripheral neuropathy, and he states he has experienced numbness to the affected site.   Past Medical History  Diagnosis Date  . Allergy   . Cataract   . Diabetes mellitus without complication   . Thyroid disease    Current Outpatient Prescriptions on File Prior to Visit  Medication Sig Dispense Refill  . beta carotene w/minerals (OCUVITE) tablet Take 1 tablet by mouth daily.      . cholecalciferol (VITAMIN D) 1000 UNITS tablet Take 2,000 Units by mouth daily.      . DULoxetine (CYMBALTA) 60 MG capsule Take 60 mg by mouth 2 (two) times daily.      Marland Kitchen ipratropium (ATROVENT) 0.03 % nasal spray Place 2 sprays into the nose 2 (two) times daily.  30 mL  3  . levothyroxine (SYNTHROID, LEVOTHROID) 112 MCG tablet Take 112 mcg by mouth daily.      . metFORMIN (GLUCOPHAGE) 1000 MG tablet Take 1,000 mg by mouth 2 (two) times daily with a meal.      . pregabalin (LYRICA) 150 MG capsule Take 150 mg by mouth 2 (two) times daily.      . rosuvastatin (CRESTOR) 10 MG tablet Take 10 mg by mouth at bedtime.      . temazepam (RESTORIL) 7.5 MG capsule Take 15 mg by mouth at bedtime as needed.       No current facility-administered medications on file prior to visit.    No Known  Allergies   Review of Systems  Constitutional: Negative for fever.  Skin: Positive for wound.   Triage Vitals: BP 126/86  Pulse 71  Temp(Src) 98.1 F (36.7 C) (Oral)  Resp 18  Ht 5' 10.5" (1.791 m)  Wt 203 lb (92.08 kg)  BMI 28.71 kg/m2  SpO2 98%     Objective:   Physical Exam  Nursing note and vitals reviewed. Constitutional: He is oriented to person, place, and time. He appears well-developed and well-nourished. No distress.  HENT:  Head: Normocephalic and atraumatic.  Eyes: Conjunctivae and EOM are normal.  Neck: Normal range of motion. No tracheal deviation present.  Cardiovascular: Normal rate.   Pulmonary/Chest: Effort normal. No respiratory distress.  Musculoskeletal: Normal range of motion.  Neurological: He is alert and oriented to person, place, and time.  Skin: Skin is warm and dry.  Bright blue stitches to 2 inch well-healed laceration on right anterior shin.   Psychiatric: He has a normal mood and affect. His behavior is normal.       Assessment & Plan:  Sutures removed by CMA Sharyn Lull w/o difficulty. Post-op laceration care reviewed.  I personally performed the services described in this  documentation, which was scribed in my presence. The recorded information has been reviewed and considered, and addended by me as needed.  Delman Cheadle, MD MPH

## 2013-10-12 DIAGNOSIS — IMO0002 Reserved for concepts with insufficient information to code with codable children: Secondary | ICD-10-CM | POA: Diagnosis not present

## 2013-10-12 DIAGNOSIS — M5137 Other intervertebral disc degeneration, lumbosacral region: Secondary | ICD-10-CM | POA: Diagnosis not present

## 2013-10-17 ENCOUNTER — Other Ambulatory Visit: Payer: Self-pay | Admitting: Orthopedic Surgery

## 2013-10-17 DIAGNOSIS — M545 Low back pain, unspecified: Secondary | ICD-10-CM

## 2013-10-17 DIAGNOSIS — M4800 Spinal stenosis, site unspecified: Secondary | ICD-10-CM

## 2013-10-17 DIAGNOSIS — IMO0002 Reserved for concepts with insufficient information to code with codable children: Secondary | ICD-10-CM

## 2013-10-24 ENCOUNTER — Ambulatory Visit
Admission: RE | Admit: 2013-10-24 | Discharge: 2013-10-24 | Disposition: A | Discharge status: Home / Self Care | Payer: Medicare Other | Source: Ambulatory Visit | Attending: Orthopedic Surgery | Admitting: Orthopedic Surgery

## 2013-10-24 DIAGNOSIS — M47817 Spondylosis without myelopathy or radiculopathy, lumbosacral region: Secondary | ICD-10-CM | POA: Diagnosis not present

## 2013-10-24 DIAGNOSIS — IMO0002 Reserved for concepts with insufficient information to code with codable children: Secondary | ICD-10-CM

## 2013-10-24 DIAGNOSIS — M4800 Spinal stenosis, site unspecified: Secondary | ICD-10-CM

## 2013-10-24 DIAGNOSIS — M545 Low back pain, unspecified: Secondary | ICD-10-CM

## 2013-10-24 DIAGNOSIS — M48061 Spinal stenosis, lumbar region without neurogenic claudication: Secondary | ICD-10-CM | POA: Diagnosis not present

## 2013-10-24 DIAGNOSIS — M5126 Other intervertebral disc displacement, lumbar region: Secondary | ICD-10-CM | POA: Diagnosis not present

## 2013-11-02 DIAGNOSIS — IMO0002 Reserved for concepts with insufficient information to code with codable children: Secondary | ICD-10-CM | POA: Diagnosis not present

## 2013-11-02 DIAGNOSIS — M48061 Spinal stenosis, lumbar region without neurogenic claudication: Secondary | ICD-10-CM | POA: Diagnosis not present

## 2014-01-17 DIAGNOSIS — E0789 Other specified disorders of thyroid: Secondary | ICD-10-CM | POA: Diagnosis not present

## 2014-01-17 DIAGNOSIS — E789 Disorder of lipoprotein metabolism, unspecified: Secondary | ICD-10-CM | POA: Diagnosis not present

## 2014-01-17 DIAGNOSIS — Z125 Encounter for screening for malignant neoplasm of prostate: Secondary | ICD-10-CM | POA: Diagnosis not present

## 2014-01-17 DIAGNOSIS — E119 Type 2 diabetes mellitus without complications: Secondary | ICD-10-CM | POA: Diagnosis not present

## 2014-01-24 DIAGNOSIS — E119 Type 2 diabetes mellitus without complications: Secondary | ICD-10-CM | POA: Diagnosis not present

## 2014-01-24 DIAGNOSIS — G589 Mononeuropathy, unspecified: Secondary | ICD-10-CM | POA: Diagnosis not present

## 2014-01-24 DIAGNOSIS — E0789 Other specified disorders of thyroid: Secondary | ICD-10-CM | POA: Diagnosis not present

## 2014-05-01 DIAGNOSIS — D229 Melanocytic nevi, unspecified: Secondary | ICD-10-CM | POA: Diagnosis not present

## 2014-05-01 DIAGNOSIS — L570 Actinic keratosis: Secondary | ICD-10-CM | POA: Diagnosis not present

## 2014-05-01 DIAGNOSIS — L821 Other seborrheic keratosis: Secondary | ICD-10-CM | POA: Diagnosis not present

## 2014-05-01 DIAGNOSIS — Z85828 Personal history of other malignant neoplasm of skin: Secondary | ICD-10-CM | POA: Diagnosis not present

## 2014-07-18 DIAGNOSIS — E118 Type 2 diabetes mellitus with unspecified complications: Secondary | ICD-10-CM | POA: Diagnosis not present

## 2014-07-25 DIAGNOSIS — E118 Type 2 diabetes mellitus with unspecified complications: Secondary | ICD-10-CM | POA: Diagnosis not present

## 2014-07-25 DIAGNOSIS — G629 Polyneuropathy, unspecified: Secondary | ICD-10-CM | POA: Diagnosis not present

## 2014-07-25 DIAGNOSIS — E789 Disorder of lipoprotein metabolism, unspecified: Secondary | ICD-10-CM | POA: Diagnosis not present

## 2014-09-01 DIAGNOSIS — R1030 Lower abdominal pain, unspecified: Secondary | ICD-10-CM | POA: Diagnosis not present

## 2014-09-18 DIAGNOSIS — S76211D Strain of adductor muscle, fascia and tendon of right thigh, subsequent encounter: Secondary | ICD-10-CM | POA: Diagnosis not present

## 2014-09-22 DIAGNOSIS — M5416 Radiculopathy, lumbar region: Secondary | ICD-10-CM | POA: Diagnosis not present

## 2014-10-26 DIAGNOSIS — H52213 Irregular astigmatism, bilateral: Secondary | ICD-10-CM | POA: Diagnosis not present

## 2015-01-15 DIAGNOSIS — E789 Disorder of lipoprotein metabolism, unspecified: Secondary | ICD-10-CM | POA: Diagnosis not present

## 2015-01-15 DIAGNOSIS — E118 Type 2 diabetes mellitus with unspecified complications: Secondary | ICD-10-CM | POA: Diagnosis not present

## 2015-01-15 DIAGNOSIS — E0789 Other specified disorders of thyroid: Secondary | ICD-10-CM | POA: Diagnosis not present

## 2015-01-15 DIAGNOSIS — Z125 Encounter for screening for malignant neoplasm of prostate: Secondary | ICD-10-CM | POA: Diagnosis not present

## 2015-01-22 DIAGNOSIS — R002 Palpitations: Secondary | ICD-10-CM | POA: Diagnosis not present

## 2015-01-22 DIAGNOSIS — E118 Type 2 diabetes mellitus with unspecified complications: Secondary | ICD-10-CM | POA: Diagnosis not present

## 2015-01-22 DIAGNOSIS — E039 Hypothyroidism, unspecified: Secondary | ICD-10-CM | POA: Diagnosis not present

## 2015-01-22 DIAGNOSIS — E789 Disorder of lipoprotein metabolism, unspecified: Secondary | ICD-10-CM | POA: Diagnosis not present

## 2015-01-23 DIAGNOSIS — R002 Palpitations: Secondary | ICD-10-CM | POA: Diagnosis not present

## 2015-01-26 DIAGNOSIS — Z23 Encounter for immunization: Secondary | ICD-10-CM | POA: Diagnosis not present

## 2015-05-03 DIAGNOSIS — D225 Melanocytic nevi of trunk: Secondary | ICD-10-CM | POA: Diagnosis not present

## 2015-05-03 DIAGNOSIS — L570 Actinic keratosis: Secondary | ICD-10-CM | POA: Diagnosis not present

## 2015-05-03 DIAGNOSIS — Z85828 Personal history of other malignant neoplasm of skin: Secondary | ICD-10-CM | POA: Diagnosis not present

## 2015-05-03 DIAGNOSIS — L821 Other seborrheic keratosis: Secondary | ICD-10-CM | POA: Diagnosis not present

## 2015-05-03 DIAGNOSIS — Z23 Encounter for immunization: Secondary | ICD-10-CM | POA: Diagnosis not present

## 2015-07-16 DIAGNOSIS — E789 Disorder of lipoprotein metabolism, unspecified: Secondary | ICD-10-CM | POA: Diagnosis not present

## 2015-07-16 DIAGNOSIS — Z125 Encounter for screening for malignant neoplasm of prostate: Secondary | ICD-10-CM | POA: Diagnosis not present

## 2015-07-16 DIAGNOSIS — E118 Type 2 diabetes mellitus with unspecified complications: Secondary | ICD-10-CM | POA: Diagnosis not present

## 2015-07-16 DIAGNOSIS — E0789 Other specified disorders of thyroid: Secondary | ICD-10-CM | POA: Diagnosis not present

## 2015-07-23 DIAGNOSIS — E118 Type 2 diabetes mellitus with unspecified complications: Secondary | ICD-10-CM | POA: Diagnosis not present

## 2015-07-23 DIAGNOSIS — G629 Polyneuropathy, unspecified: Secondary | ICD-10-CM | POA: Diagnosis not present

## 2015-08-02 DIAGNOSIS — L820 Inflamed seborrheic keratosis: Secondary | ICD-10-CM | POA: Diagnosis not present

## 2015-08-02 DIAGNOSIS — L570 Actinic keratosis: Secondary | ICD-10-CM | POA: Diagnosis not present

## 2015-08-02 DIAGNOSIS — D485 Neoplasm of uncertain behavior of skin: Secondary | ICD-10-CM | POA: Diagnosis not present

## 2015-08-02 DIAGNOSIS — L609 Nail disorder, unspecified: Secondary | ICD-10-CM | POA: Diagnosis not present

## 2016-01-15 DIAGNOSIS — Z23 Encounter for immunization: Secondary | ICD-10-CM | POA: Diagnosis not present

## 2016-01-15 DIAGNOSIS — E789 Disorder of lipoprotein metabolism, unspecified: Secondary | ICD-10-CM | POA: Diagnosis not present

## 2016-01-15 DIAGNOSIS — Z0000 Encounter for general adult medical examination without abnormal findings: Secondary | ICD-10-CM | POA: Diagnosis not present

## 2016-01-15 DIAGNOSIS — E0789 Other specified disorders of thyroid: Secondary | ICD-10-CM | POA: Diagnosis not present

## 2016-01-15 DIAGNOSIS — E118 Type 2 diabetes mellitus with unspecified complications: Secondary | ICD-10-CM | POA: Diagnosis not present

## 2016-01-24 DIAGNOSIS — E118 Type 2 diabetes mellitus with unspecified complications: Secondary | ICD-10-CM | POA: Diagnosis not present

## 2016-01-24 DIAGNOSIS — G629 Polyneuropathy, unspecified: Secondary | ICD-10-CM | POA: Diagnosis not present

## 2016-01-24 DIAGNOSIS — E032 Hypothyroidism due to medicaments and other exogenous substances: Secondary | ICD-10-CM | POA: Diagnosis not present

## 2016-03-05 IMAGING — MR MR LUMBAR SPINE W/O CM
5 series · 46 of 48 positions shown · non-contrast
Comparison: MRI lumbar spine 02/16/2009

CLINICAL DATA: Low back pain extending into the groin bilaterally.
History of less severe chronic low back pain.

EXAM:
MRI LUMBAR SPINE WITHOUT CONTRAST
TECHNIQUE: Multiplanar, multisequence MR imaging of the lumbar spine was
performed. No intravenous contrast was administered.

[Series 3: T2 · sagittal · 4.0mm · 0.88mm/px · 6 of 13 slices shown (1 of 2)]
[im 1/13]
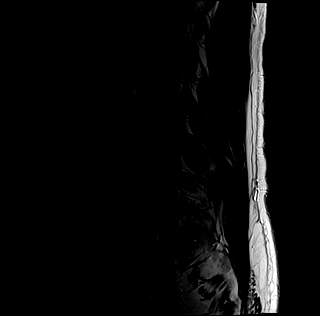
[im 3/13]
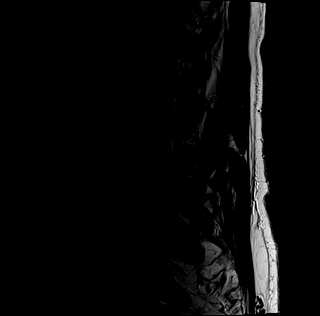
[im 5/13]
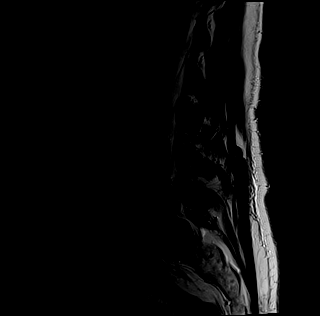
[im 8/13]
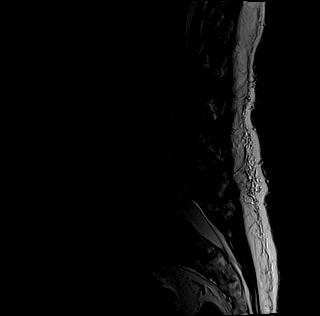
[im 10/13]
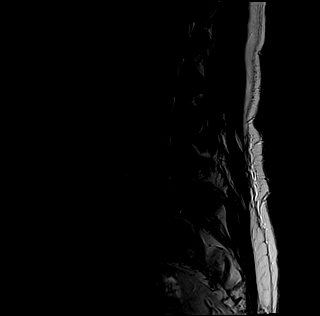
[im 13/13]
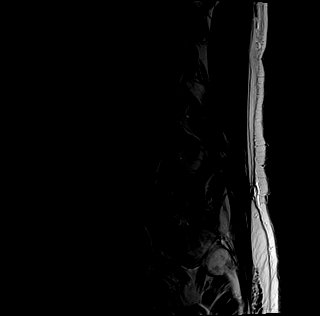

[Series 4: tirm sag · sagittal · 4.0mm · 0.55mm/px · 6 of 13 slices shown]
[im 1/13]
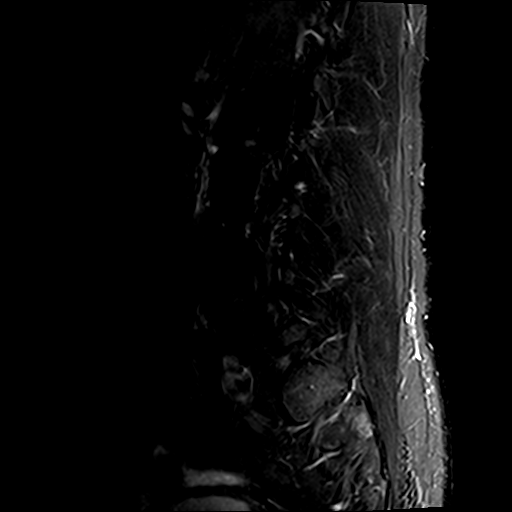
[im 3/13]
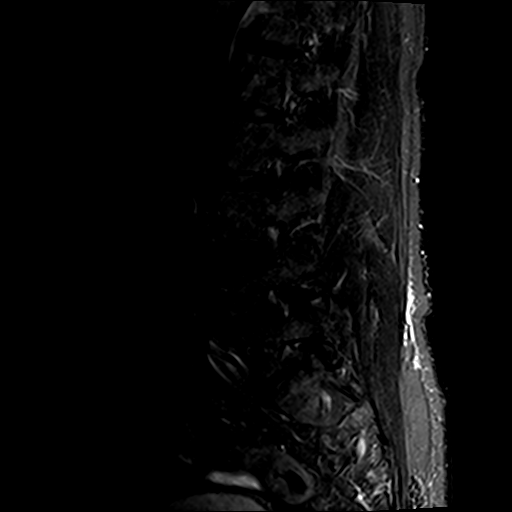
[im 5/13]
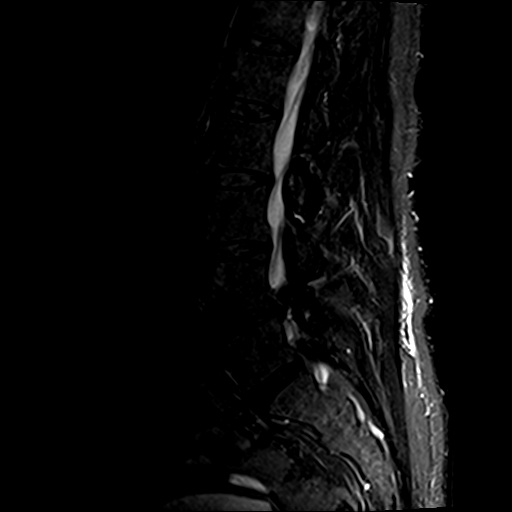
[im 8/13]
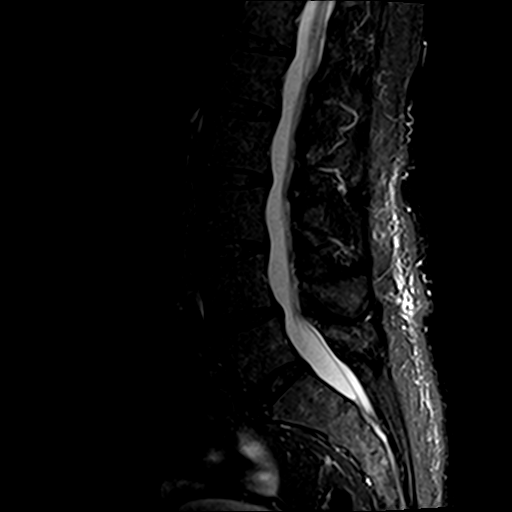
[im 10/13]
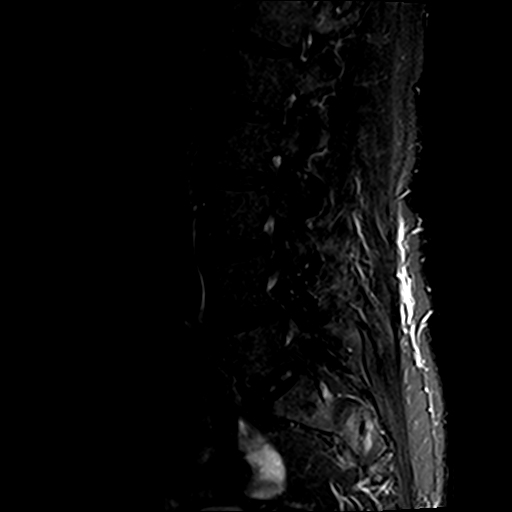
[im 13/13]
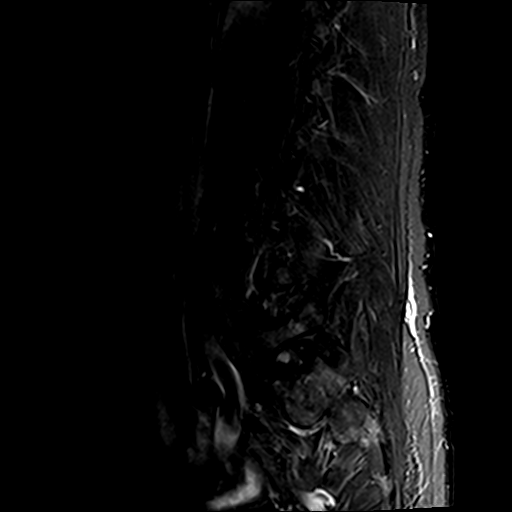

[Series 5: T1 · sagittal · 4.0mm · 0.88mm/px · 6 of 13 slices shown (1 of 2)]
[im 1/13]
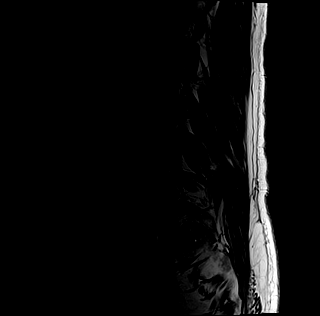
[im 3/13]
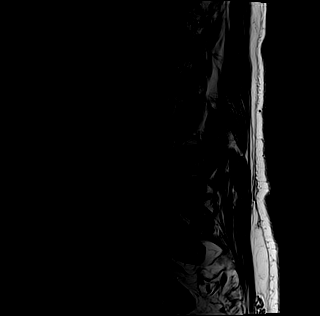
[im 5/13]
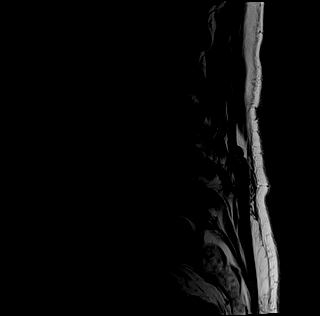
[im 8/13]
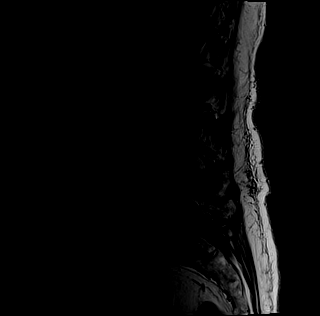
[im 10/13]
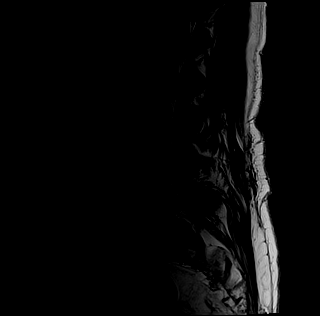
[im 13/13]
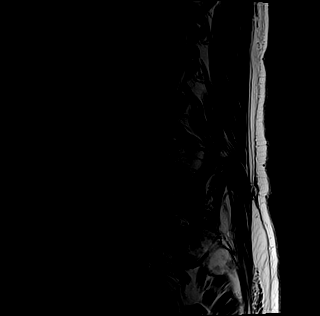

[Series 6: T1 · axial · 4.0mm · 0.78mm/px · z∈[-108,+89]mm · 13 of 34 slices shown (2 of 2)]
[im 1/34]
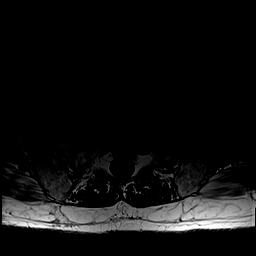
[im 3/34]
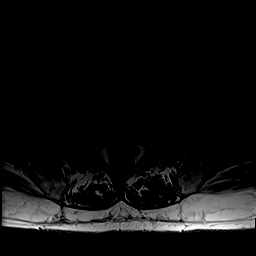
[im 5/34]
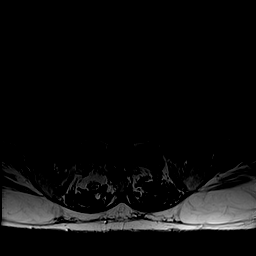
[im 8/34]
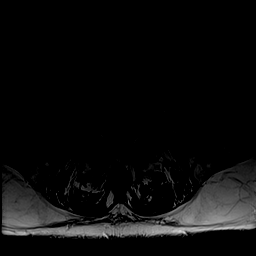
[im 10/34]
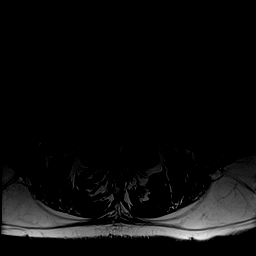
[im 12/34]
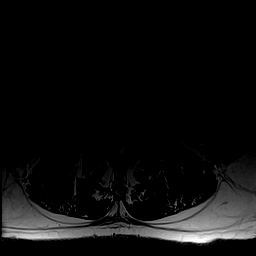
[im 15/34]
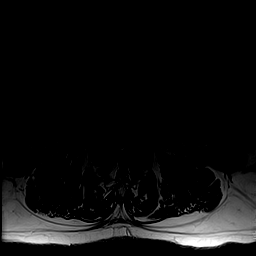
[im 17/34]
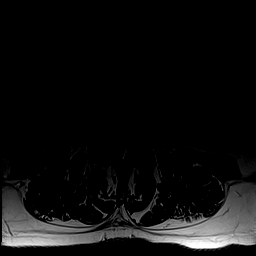
[im 19/34]
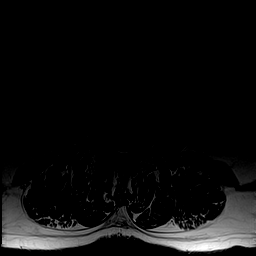
[im 22/34]
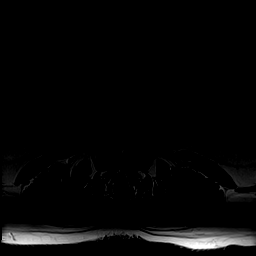
[im 24/34]
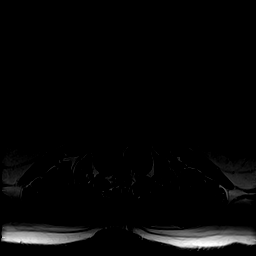
[im 29/34]
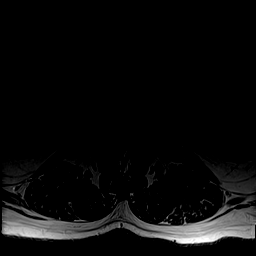
[im 34/34]
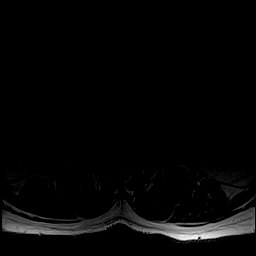

[Series 7: T2 · axial · 4.0mm · 0.78mm/px · z∈[-107,+88]mm · 15 of 34 slices shown (2 of 2)]
[im 1/34]
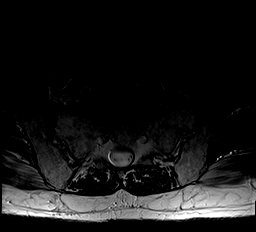
[im 3/34]
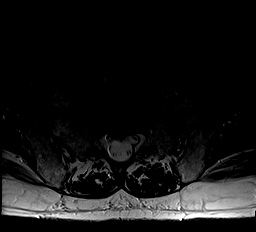
[im 5/34]
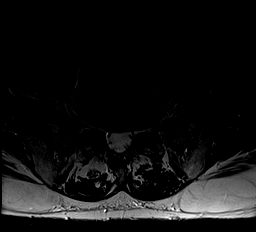
[im 8/34]
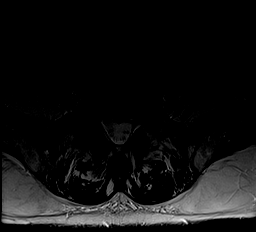
[im 10/34]
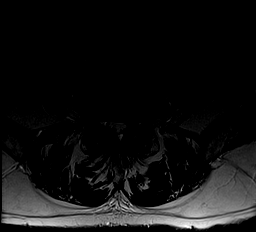
[im 12/34]
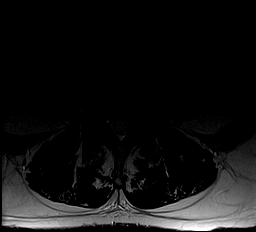
[im 15/34]
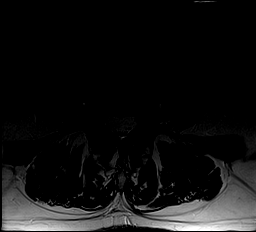
[im 17/34]
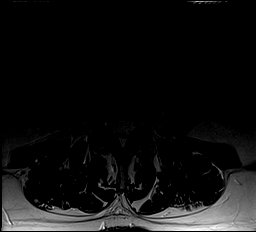
[im 19/34]
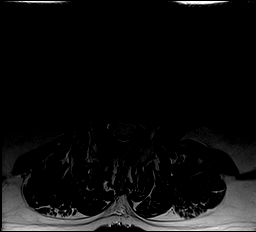
[im 22/34]
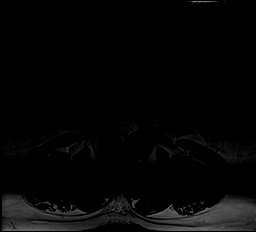
[im 24/34]
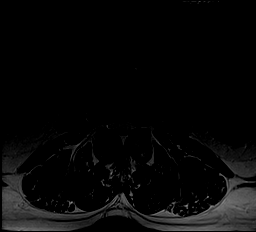
[im 26/34]
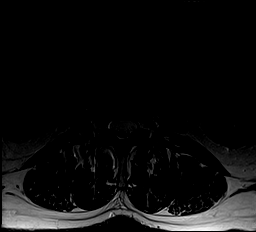
[im 29/34]
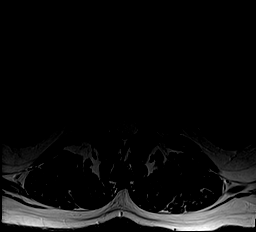
[im 31/34]
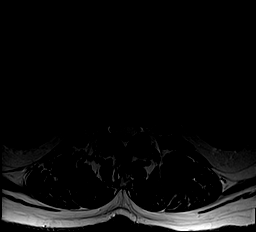
[im 34/34]
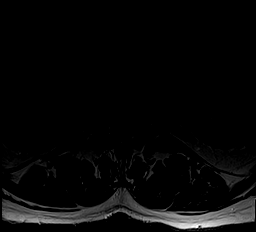

[46 of 48 positions shown; findings below may reference images not displayed]

FINDINGS: Normal signal is present in the conus medullaris which terminates at
L1. Marrow signal, vertebral body heights, and alignment is normal.
Limited imaging of the abdomen is unremarkable.

L1-2:  Negative.

L2-3: Mild facet hypertrophy is present bilaterally. There is mild
lateral disc bulging without significant stenosis.

L3-4: Mild facet hypertrophy is present bilaterally. There is no
significant stenosis.

L4-5: A broad-based disc protrusion is asymmetric to the left.
Moderate facet hypertrophy and spurring results in moderate left and
mild right lateral recess narrowing. Mild foraminal narrowing is
present bilaterally.

L5-S1: Mild disc bulging is present. Mild facet hypertrophy is
present bilaterally without significant stenosis.
IMPRESSION: 1. Disc bulging and facet hypertrophy at L2-3, L3-4, and L5-S1
without significant stenosis at these levels.
2. Moderate left and mild right foraminal stenosis at L4-5.
3. Mild foraminal narrowing bilaterally at L4-5.

## 2016-05-15 DIAGNOSIS — D485 Neoplasm of uncertain behavior of skin: Secondary | ICD-10-CM | POA: Diagnosis not present

## 2016-05-15 DIAGNOSIS — Z23 Encounter for immunization: Secondary | ICD-10-CM | POA: Diagnosis not present

## 2016-05-15 DIAGNOSIS — D225 Melanocytic nevi of trunk: Secondary | ICD-10-CM | POA: Diagnosis not present

## 2016-05-15 DIAGNOSIS — Z85828 Personal history of other malignant neoplasm of skin: Secondary | ICD-10-CM | POA: Diagnosis not present

## 2016-05-15 DIAGNOSIS — R238 Other skin changes: Secondary | ICD-10-CM | POA: Diagnosis not present

## 2016-05-15 DIAGNOSIS — L821 Other seborrheic keratosis: Secondary | ICD-10-CM | POA: Diagnosis not present

## 2016-05-19 DIAGNOSIS — L570 Actinic keratosis: Secondary | ICD-10-CM | POA: Diagnosis not present

## 2016-07-17 DIAGNOSIS — E789 Disorder of lipoprotein metabolism, unspecified: Secondary | ICD-10-CM | POA: Diagnosis not present

## 2016-07-17 DIAGNOSIS — E0789 Other specified disorders of thyroid: Secondary | ICD-10-CM | POA: Diagnosis not present

## 2016-07-17 DIAGNOSIS — E118 Type 2 diabetes mellitus with unspecified complications: Secondary | ICD-10-CM | POA: Diagnosis not present

## 2016-07-21 DIAGNOSIS — L570 Actinic keratosis: Secondary | ICD-10-CM | POA: Diagnosis not present

## 2016-07-23 DIAGNOSIS — G629 Polyneuropathy, unspecified: Secondary | ICD-10-CM | POA: Diagnosis not present

## 2016-07-23 DIAGNOSIS — E789 Disorder of lipoprotein metabolism, unspecified: Secondary | ICD-10-CM | POA: Diagnosis not present

## 2016-07-23 DIAGNOSIS — E118 Type 2 diabetes mellitus with unspecified complications: Secondary | ICD-10-CM | POA: Diagnosis not present

## 2016-10-14 DIAGNOSIS — H47012 Ischemic optic neuropathy, left eye: Secondary | ICD-10-CM | POA: Diagnosis not present

## 2016-10-14 DIAGNOSIS — H25811 Combined forms of age-related cataract, right eye: Secondary | ICD-10-CM | POA: Diagnosis not present

## 2016-10-14 DIAGNOSIS — E119 Type 2 diabetes mellitus without complications: Secondary | ICD-10-CM | POA: Diagnosis not present

## 2016-10-14 DIAGNOSIS — H40013 Open angle with borderline findings, low risk, bilateral: Secondary | ICD-10-CM | POA: Diagnosis not present

## 2016-10-14 DIAGNOSIS — Z961 Presence of intraocular lens: Secondary | ICD-10-CM | POA: Diagnosis not present

## 2016-10-14 DIAGNOSIS — H43811 Vitreous degeneration, right eye: Secondary | ICD-10-CM | POA: Diagnosis not present

## 2017-02-03 DIAGNOSIS — D126 Benign neoplasm of colon, unspecified: Secondary | ICD-10-CM | POA: Insufficient documentation

## 2017-02-03 DIAGNOSIS — Z23 Encounter for immunization: Secondary | ICD-10-CM | POA: Diagnosis not present

## 2017-02-03 DIAGNOSIS — R269 Unspecified abnormalities of gait and mobility: Secondary | ICD-10-CM | POA: Diagnosis not present

## 2017-02-03 DIAGNOSIS — E1142 Type 2 diabetes mellitus with diabetic polyneuropathy: Secondary | ICD-10-CM | POA: Diagnosis not present

## 2017-02-03 DIAGNOSIS — F419 Anxiety disorder, unspecified: Secondary | ICD-10-CM | POA: Insufficient documentation

## 2017-02-03 DIAGNOSIS — F418 Other specified anxiety disorders: Secondary | ICD-10-CM | POA: Diagnosis not present

## 2017-02-03 DIAGNOSIS — E890 Postprocedural hypothyroidism: Secondary | ICD-10-CM | POA: Insufficient documentation

## 2017-02-03 DIAGNOSIS — E7849 Other hyperlipidemia: Secondary | ICD-10-CM | POA: Diagnosis not present

## 2017-02-03 DIAGNOSIS — Z6827 Body mass index (BMI) 27.0-27.9, adult: Secondary | ICD-10-CM | POA: Diagnosis not present

## 2017-02-03 DIAGNOSIS — E038 Other specified hypothyroidism: Secondary | ICD-10-CM | POA: Diagnosis not present

## 2017-02-03 DIAGNOSIS — I779 Disorder of arteries and arterioles, unspecified: Secondary | ICD-10-CM | POA: Diagnosis not present

## 2017-02-03 DIAGNOSIS — E1149 Type 2 diabetes mellitus with other diabetic neurological complication: Secondary | ICD-10-CM | POA: Diagnosis not present

## 2017-02-03 DIAGNOSIS — E785 Hyperlipidemia, unspecified: Secondary | ICD-10-CM | POA: Insufficient documentation

## 2017-02-03 DIAGNOSIS — Z0000 Encounter for general adult medical examination without abnormal findings: Secondary | ICD-10-CM | POA: Diagnosis not present

## 2017-02-03 DIAGNOSIS — Z1389 Encounter for screening for other disorder: Secondary | ICD-10-CM | POA: Diagnosis not present

## 2017-03-10 DIAGNOSIS — D126 Benign neoplasm of colon, unspecified: Secondary | ICD-10-CM | POA: Diagnosis not present

## 2017-03-10 DIAGNOSIS — K635 Polyp of colon: Secondary | ICD-10-CM | POA: Diagnosis not present

## 2017-03-10 DIAGNOSIS — Z86010 Personal history of colonic polyps: Secondary | ICD-10-CM | POA: Diagnosis not present

## 2017-03-12 DIAGNOSIS — K635 Polyp of colon: Secondary | ICD-10-CM | POA: Diagnosis not present

## 2017-03-12 DIAGNOSIS — D126 Benign neoplasm of colon, unspecified: Secondary | ICD-10-CM | POA: Diagnosis not present

## 2017-04-29 DIAGNOSIS — J209 Acute bronchitis, unspecified: Secondary | ICD-10-CM | POA: Diagnosis not present

## 2017-04-29 DIAGNOSIS — R0981 Nasal congestion: Secondary | ICD-10-CM | POA: Diagnosis not present

## 2017-04-29 DIAGNOSIS — E1149 Type 2 diabetes mellitus with other diabetic neurological complication: Secondary | ICD-10-CM | POA: Diagnosis not present

## 2017-04-29 DIAGNOSIS — J449 Chronic obstructive pulmonary disease, unspecified: Secondary | ICD-10-CM | POA: Insufficient documentation

## 2017-06-24 DIAGNOSIS — Z85828 Personal history of other malignant neoplasm of skin: Secondary | ICD-10-CM | POA: Diagnosis not present

## 2017-06-24 DIAGNOSIS — L299 Pruritus, unspecified: Secondary | ICD-10-CM | POA: Diagnosis not present

## 2017-06-24 DIAGNOSIS — Z23 Encounter for immunization: Secondary | ICD-10-CM | POA: Diagnosis not present

## 2017-06-24 DIAGNOSIS — L821 Other seborrheic keratosis: Secondary | ICD-10-CM | POA: Diagnosis not present

## 2017-06-24 DIAGNOSIS — L570 Actinic keratosis: Secondary | ICD-10-CM | POA: Diagnosis not present

## 2017-06-24 DIAGNOSIS — L820 Inflamed seborrheic keratosis: Secondary | ICD-10-CM | POA: Diagnosis not present

## 2017-06-24 DIAGNOSIS — D225 Melanocytic nevi of trunk: Secondary | ICD-10-CM | POA: Diagnosis not present

## 2017-08-03 DIAGNOSIS — I779 Disorder of arteries and arterioles, unspecified: Secondary | ICD-10-CM | POA: Diagnosis not present

## 2017-08-03 DIAGNOSIS — E7849 Other hyperlipidemia: Secondary | ICD-10-CM | POA: Diagnosis not present

## 2017-08-03 DIAGNOSIS — E1142 Type 2 diabetes mellitus with diabetic polyneuropathy: Secondary | ICD-10-CM | POA: Diagnosis not present

## 2017-08-03 DIAGNOSIS — D126 Benign neoplasm of colon, unspecified: Secondary | ICD-10-CM | POA: Diagnosis not present

## 2017-08-03 DIAGNOSIS — Z6827 Body mass index (BMI) 27.0-27.9, adult: Secondary | ICD-10-CM | POA: Diagnosis not present

## 2017-08-03 DIAGNOSIS — R2689 Other abnormalities of gait and mobility: Secondary | ICD-10-CM | POA: Diagnosis not present

## 2017-08-03 DIAGNOSIS — J449 Chronic obstructive pulmonary disease, unspecified: Secondary | ICD-10-CM | POA: Diagnosis not present

## 2017-08-03 DIAGNOSIS — E890 Postprocedural hypothyroidism: Secondary | ICD-10-CM | POA: Diagnosis not present

## 2017-08-03 DIAGNOSIS — E1149 Type 2 diabetes mellitus with other diabetic neurological complication: Secondary | ICD-10-CM | POA: Diagnosis not present

## 2017-08-27 DIAGNOSIS — H903 Sensorineural hearing loss, bilateral: Secondary | ICD-10-CM | POA: Diagnosis not present

## 2017-09-02 DIAGNOSIS — H2511 Age-related nuclear cataract, right eye: Secondary | ICD-10-CM | POA: Diagnosis not present

## 2017-09-03 DIAGNOSIS — Z961 Presence of intraocular lens: Secondary | ICD-10-CM | POA: Diagnosis not present

## 2017-09-03 DIAGNOSIS — H25811 Combined forms of age-related cataract, right eye: Secondary | ICD-10-CM | POA: Diagnosis not present

## 2017-09-03 DIAGNOSIS — H47012 Ischemic optic neuropathy, left eye: Secondary | ICD-10-CM | POA: Diagnosis not present

## 2017-09-03 DIAGNOSIS — E119 Type 2 diabetes mellitus without complications: Secondary | ICD-10-CM | POA: Diagnosis not present

## 2017-09-07 DIAGNOSIS — H2511 Age-related nuclear cataract, right eye: Secondary | ICD-10-CM | POA: Diagnosis not present

## 2017-09-07 DIAGNOSIS — H25811 Combined forms of age-related cataract, right eye: Secondary | ICD-10-CM | POA: Diagnosis not present

## 2017-12-03 DIAGNOSIS — I779 Disorder of arteries and arterioles, unspecified: Secondary | ICD-10-CM | POA: Diagnosis not present

## 2017-12-03 DIAGNOSIS — E890 Postprocedural hypothyroidism: Secondary | ICD-10-CM | POA: Diagnosis not present

## 2017-12-03 DIAGNOSIS — E1149 Type 2 diabetes mellitus with other diabetic neurological complication: Secondary | ICD-10-CM | POA: Diagnosis not present

## 2017-12-03 DIAGNOSIS — N401 Enlarged prostate with lower urinary tract symptoms: Secondary | ICD-10-CM | POA: Diagnosis not present

## 2017-12-03 DIAGNOSIS — F418 Other specified anxiety disorders: Secondary | ICD-10-CM | POA: Diagnosis not present

## 2017-12-03 DIAGNOSIS — D126 Benign neoplasm of colon, unspecified: Secondary | ICD-10-CM | POA: Diagnosis not present

## 2017-12-03 DIAGNOSIS — Z6827 Body mass index (BMI) 27.0-27.9, adult: Secondary | ICD-10-CM | POA: Diagnosis not present

## 2017-12-03 DIAGNOSIS — J449 Chronic obstructive pulmonary disease, unspecified: Secondary | ICD-10-CM | POA: Diagnosis not present

## 2017-12-03 DIAGNOSIS — R269 Unspecified abnormalities of gait and mobility: Secondary | ICD-10-CM | POA: Diagnosis not present

## 2018-03-02 DIAGNOSIS — Z23 Encounter for immunization: Secondary | ICD-10-CM | POA: Diagnosis not present

## 2018-03-29 DIAGNOSIS — E1149 Type 2 diabetes mellitus with other diabetic neurological complication: Secondary | ICD-10-CM | POA: Diagnosis not present

## 2018-03-29 DIAGNOSIS — Z125 Encounter for screening for malignant neoplasm of prostate: Secondary | ICD-10-CM | POA: Diagnosis not present

## 2018-03-29 DIAGNOSIS — R82998 Other abnormal findings in urine: Secondary | ICD-10-CM | POA: Diagnosis not present

## 2018-03-29 DIAGNOSIS — E038 Other specified hypothyroidism: Secondary | ICD-10-CM | POA: Diagnosis not present

## 2018-03-29 DIAGNOSIS — Z79899 Other long term (current) drug therapy: Secondary | ICD-10-CM | POA: Diagnosis not present

## 2018-04-05 DIAGNOSIS — E7849 Other hyperlipidemia: Secondary | ICD-10-CM | POA: Diagnosis not present

## 2018-04-05 DIAGNOSIS — E1142 Type 2 diabetes mellitus with diabetic polyneuropathy: Secondary | ICD-10-CM | POA: Diagnosis not present

## 2018-04-05 DIAGNOSIS — Z0000 Encounter for general adult medical examination without abnormal findings: Secondary | ICD-10-CM | POA: Diagnosis not present

## 2018-04-05 DIAGNOSIS — E1149 Type 2 diabetes mellitus with other diabetic neurological complication: Secondary | ICD-10-CM | POA: Diagnosis not present

## 2018-04-05 DIAGNOSIS — I779 Disorder of arteries and arterioles, unspecified: Secondary | ICD-10-CM | POA: Diagnosis not present

## 2018-04-05 DIAGNOSIS — J449 Chronic obstructive pulmonary disease, unspecified: Secondary | ICD-10-CM | POA: Diagnosis not present

## 2018-04-05 DIAGNOSIS — E890 Postprocedural hypothyroidism: Secondary | ICD-10-CM | POA: Diagnosis not present

## 2018-05-12 ENCOUNTER — Telehealth (HOSPITAL_COMMUNITY): Payer: Self-pay | Admitting: *Deleted

## 2018-05-12 NOTE — Telephone Encounter (Signed)
Left VM. 2nd attempt to reach pt to schedule carotid u/s. EM

## 2018-05-13 ENCOUNTER — Other Ambulatory Visit (HOSPITAL_COMMUNITY): Payer: Self-pay | Admitting: Endocrinology

## 2018-05-13 DIAGNOSIS — I779 Disorder of arteries and arterioles, unspecified: Secondary | ICD-10-CM

## 2018-05-13 DIAGNOSIS — I739 Peripheral vascular disease, unspecified: Principal | ICD-10-CM

## 2018-05-14 ENCOUNTER — Ambulatory Visit (HOSPITAL_COMMUNITY)
Admission: RE | Admit: 2018-05-14 | Discharge: 2018-05-14 | Disposition: A | Discharge status: Home / Self Care | Payer: Medicare Other | Source: Ambulatory Visit | Attending: Endocrinology | Admitting: Endocrinology

## 2018-05-14 DIAGNOSIS — I739 Peripheral vascular disease, unspecified: Secondary | ICD-10-CM | POA: Diagnosis present

## 2018-05-14 DIAGNOSIS — I779 Disorder of arteries and arterioles, unspecified: Secondary | ICD-10-CM

## 2019-04-07 DIAGNOSIS — N1831 Chronic kidney disease, stage 3a: Secondary | ICD-10-CM | POA: Insufficient documentation

## 2019-04-07 DIAGNOSIS — G4700 Insomnia, unspecified: Secondary | ICD-10-CM | POA: Insufficient documentation

## 2019-05-27 ENCOUNTER — Ambulatory Visit: Payer: Medicare Other | Attending: Internal Medicine

## 2019-05-27 DIAGNOSIS — Z23 Encounter for immunization: Secondary | ICD-10-CM | POA: Insufficient documentation

## 2019-05-27 NOTE — Progress Notes (Signed)
   Covid-19 Vaccination Clinic  Name:  Gabriel Evans    MRN: [id number] DOB: 1941/02/18  05/27/2019  [name] was observed post Covid-19 immunization for 15 minutes without incidence. He was provided with Vaccine Information Sheet and instruction to access the V-Safe system.   [name] was instructed to call 911 with any severe reactions post vaccine: Marland Kitchen Difficulty breathing  . Swelling of your face and throat  . A fast heartbeat  . A bad rash all over your body  . Dizziness and weakness    Immunizations Administered    Name Date Dose VIS Date Route   Pfizer COVID-19 Vaccine 05/27/2019 12:28 PM 0.3 mL 04/15/2019 Intramuscular   Manufacturer: [place]   Lot: [id number]   [place]: [id number]

## 2019-06-14 ENCOUNTER — Ambulatory Visit: Payer: Medicare Other | Attending: Internal Medicine

## 2019-06-14 DIAGNOSIS — Z23 Encounter for immunization: Secondary | ICD-10-CM | POA: Insufficient documentation

## 2019-06-14 NOTE — Progress Notes (Signed)
   Covid-19 Vaccination Clinic  Name:  Gabriel Evans    MRN: [id number] DOB: 13-Mar-1941  06/14/2019  [name] was observed post Covid-19 immunization for 15 minutes without incidence. He was provided with Vaccine Information Sheet and instruction to access the V-Safe system.   [name] was instructed to call 911 with any severe reactions post vaccine: Marland Kitchen Difficulty breathing  . Swelling of your face and throat  . A fast heartbeat  . A bad rash all over your body  . Dizziness and weakness    Immunizations Administered    Name Date Dose VIS Date Route   Pfizer COVID-19 Vaccine 06/14/2019  1:04 PM 0.3 mL 04/15/2019 Intramuscular   Manufacturer: [place]   Lot: [id number]   [place]: [id number]

## 2019-10-24 ENCOUNTER — Other Ambulatory Visit: Payer: Self-pay

## 2019-10-24 ENCOUNTER — Ambulatory Visit (INDEPENDENT_AMBULATORY_CARE_PROVIDER_SITE_OTHER): Payer: Medicare Other | Admitting: Podiatry

## 2019-10-24 ENCOUNTER — Encounter: Payer: Self-pay | Admitting: Podiatry

## 2019-10-24 DIAGNOSIS — B351 Tinea unguium: Secondary | ICD-10-CM | POA: Diagnosis not present

## 2019-10-24 DIAGNOSIS — M79675 Pain in left toe(s): Secondary | ICD-10-CM

## 2019-10-24 DIAGNOSIS — M79674 Pain in right toe(s): Secondary | ICD-10-CM

## 2019-10-24 DIAGNOSIS — E119 Type 2 diabetes mellitus without complications: Secondary | ICD-10-CM

## 2019-10-24 NOTE — Progress Notes (Signed)
  Subjective:  Patient ID: Gabriel Evans, male    DOB: March 25, 1941,  MRN: [id number]  No chief complaint on file.  79 y.o. male returns for the above complaint.  Patient presents with thickened elongated dystrophic toenails x10.  Patient states he is diabetic with last A1c of 6.2.  He is not able to reduce toenails.  He would like for [place] to debride them.  He denies any other acute complaints.  Objective:  There were no vitals filed for this visit. Podiatric Exam: Vascular: dorsalis pedis and posterior tibial pulses are palpable bilateral. Capillary return is immediate. Temperature gradient is WNL. Skin turgor WNL  Sensorium: Normal Semmes Weinstein monofilament test. Normal tactile sensation bilaterally. Nail Exam: Pt has thick disfigured discolored nails with subungual debris noted bilateral entire nail hallux through fifth toenails.  Pain on palpation to the nails. Ulcer Exam: There is no evidence of ulcer or pre-ulcerative changes or infection. Orthopedic Exam: Muscle tone and strength are WNL. No limitations in general ROM. No crepitus or effusions noted. HAV  B/L.  Hammer toes 2-5  B/L. Skin: No Porokeratosis. No infection or ulcers    Assessment & Plan:   1. Pain due to onychomycosis of toenails of both feet   2. Type 2 diabetes mellitus without complication, without long-term current use of insulin ([place])     Patient was evaluated and treated and all questions answered.  Onychomycosis with pain  -Nails palliatively debrided as below. -Educated on self-care  Procedure: Nail Debridement Rationale: pain  Type of Debridement: manual, sharp debridement. Instrumentation: Nail nipper, rotary burr. Number of Nails: 10  Procedures and Treatment: Consent by patient was obtained for treatment procedures. The patient understood the discussion of treatment and procedures well. All questions were answered thoroughly reviewed. Debridement of mycotic and hypertrophic toenails, 1 through 5  bilateral and clearing of subungual debris. No ulceration, no infection noted.  Return Visit-Office Procedure: Patient instructed to return to the office for a follow up visit 3 months for continued evaluation and treatment.  Boneta Lucks, DPM    No follow-ups on file.

## 2020-02-28 ENCOUNTER — Ambulatory Visit (INDEPENDENT_AMBULATORY_CARE_PROVIDER_SITE_OTHER): Payer: Medicare Other | Admitting: Podiatry

## 2020-02-28 ENCOUNTER — Other Ambulatory Visit: Payer: Self-pay

## 2020-02-28 ENCOUNTER — Encounter: Payer: Self-pay | Admitting: Podiatry

## 2020-02-28 DIAGNOSIS — M79674 Pain in right toe(s): Secondary | ICD-10-CM

## 2020-02-28 DIAGNOSIS — M79675 Pain in left toe(s): Secondary | ICD-10-CM | POA: Diagnosis not present

## 2020-02-28 DIAGNOSIS — E119 Type 2 diabetes mellitus without complications: Secondary | ICD-10-CM | POA: Diagnosis not present

## 2020-02-28 DIAGNOSIS — B351 Tinea unguium: Secondary | ICD-10-CM | POA: Diagnosis not present

## 2020-02-28 DIAGNOSIS — L84 Corns and callosities: Secondary | ICD-10-CM | POA: Diagnosis not present

## 2020-02-28 NOTE — Progress Notes (Signed)
This patient returns to my office for at risk foot care.  This patient requires this care by a professional since this patient will be at risk due to having diabetes.  This patient is unable to cut nails and his callus  himself since the patient cannot reach his nails.These nails are painful walking and wearing shoes.  This patient presents for at risk foot care today.  General Appearance  Alert, conversant and in no acute stress.  Vascular  Dorsalis pedis and posterior tibial  pulses are palpable  bilaterally.  Capillary return is within normal limits  bilaterally. Temperature is within normal limits  bilaterally.  Neurologic  Senn-Weinstein monofilament wire test within normal limits  bilaterally. Muscle power within normal limits bilaterally.  Nails Thick disfigured discolored nails with subungual debris  from hallux to fifth toes bilaterally. No evidence of bacterial infection or drainage bilaterally.  Orthopedic  No limitations of motion  feet .  No crepitus or effusions noted.  No bony pathology or digital deformities noted.  HAV  B/L.  Hammer toes 2-5  B/l.  Skin  normotropic skin with no porokeratosis noted bilaterally.  No signs of infections or ulcers noted.     Onychomycosis  Pain in right toes  Pain in left toes  Consent was obtained for treatment procedures.   Mechanical debridement of nails 1-5  bilaterally performed with a nail nipper.  Filed with dremel without incident.  Debride callus with # 15 blade.   Return office visit    3 months                 Told patient to return for periodic foot care and evaluation due to potential at risk complications.   Gardiner Barefoot DPM

## 2020-06-05 ENCOUNTER — Encounter: Payer: Self-pay | Admitting: Podiatry

## 2020-06-05 ENCOUNTER — Ambulatory Visit (INDEPENDENT_AMBULATORY_CARE_PROVIDER_SITE_OTHER): Payer: Medicare Other | Admitting: Podiatry

## 2020-06-05 ENCOUNTER — Other Ambulatory Visit: Payer: Self-pay

## 2020-06-05 DIAGNOSIS — B351 Tinea unguium: Secondary | ICD-10-CM

## 2020-06-05 DIAGNOSIS — M79674 Pain in right toe(s): Secondary | ICD-10-CM

## 2020-06-05 DIAGNOSIS — E119 Type 2 diabetes mellitus without complications: Secondary | ICD-10-CM | POA: Diagnosis not present

## 2020-06-05 DIAGNOSIS — L84 Corns and callosities: Secondary | ICD-10-CM

## 2020-06-05 DIAGNOSIS — M79675 Pain in left toe(s): Secondary | ICD-10-CM

## 2020-06-05 NOTE — Progress Notes (Signed)
This patient returns to my office for at risk foot care.  This patient requires this care by a professional since this patient will be at risk due to having diabetes.  This patient is unable to cut nails and his callus  himself since the patient cannot reach his nails.These nails are painful walking and wearing shoes.  This patient presents for at risk foot care today.  General Appearance  Alert, conversant and in no acute stress.  Vascular  Dorsalis pedis and posterior tibial  pulses are palpable  bilaterally.  Capillary return is within normal limits  bilaterally. Temperature is within normal limits  bilaterally.  Neurologic  Senn-Weinstein monofilament wire test diminished   bilaterally. Muscle power within normal limits bilaterally.  Nails Thick disfigured discolored nails with subungual debris  from hallux to fifth toes bilaterally. No evidence of bacterial infection or drainage bilaterally.  Orthopedic  No limitations of motion  feet .  No crepitus or effusions noted.  No bony pathology or digital deformities noted.  HAV  B/L.  Hammer toes 2-5  B/l.  Skin  normotropic skin with no porokeratosis noted bilaterally.  No signs of infections or ulcers noted.  Callus sub 1st MPJ right foot.   Onychomycosis  Pain in right toes  Pain in left toes  Consent was obtained for treatment procedures.   Mechanical debridement of nails 1-5  bilaterally performed with a nail nipper.  Filed with dremel without incident.  Debride callus with # 15 blade.   Return office visit    3 months                 Told patient to return for periodic foot care and evaluation due to potential at risk complications.   Gardiner Barefoot DPM

## 2020-09-04 ENCOUNTER — Ambulatory Visit: Payer: Medicare Other | Admitting: Podiatry

## 2020-09-10 ENCOUNTER — Other Ambulatory Visit: Payer: Self-pay

## 2020-09-10 ENCOUNTER — Encounter: Payer: Self-pay | Admitting: Podiatry

## 2020-09-10 ENCOUNTER — Ambulatory Visit (INDEPENDENT_AMBULATORY_CARE_PROVIDER_SITE_OTHER): Payer: Medicare Other | Admitting: Podiatry

## 2020-09-10 DIAGNOSIS — B351 Tinea unguium: Secondary | ICD-10-CM

## 2020-09-10 DIAGNOSIS — L84 Corns and callosities: Secondary | ICD-10-CM | POA: Diagnosis not present

## 2020-09-10 DIAGNOSIS — M79674 Pain in right toe(s): Secondary | ICD-10-CM | POA: Diagnosis not present

## 2020-09-10 DIAGNOSIS — E119 Type 2 diabetes mellitus without complications: Secondary | ICD-10-CM

## 2020-09-10 DIAGNOSIS — M79675 Pain in left toe(s): Secondary | ICD-10-CM

## 2020-09-10 NOTE — Progress Notes (Signed)
This patient returns to my office for at risk foot care.  This patient requires this care by a professional since this patient will be at risk due to having diabetes.  This patient is unable to cut nails and his callus  himself since the patient cannot reach his nails.These nails are painful walking and wearing shoes.  This patient presents for at risk foot care today.  General Appearance  Alert, conversant and in no acute stress.  Vascular  Dorsalis pedis and posterior tibial  pulses are palpable  bilaterally.  Capillary return is within normal limits  bilaterally. Temperature is within normal limits  bilaterally.  Neurologic  Senn-Weinstein monofilament wire test diminished   bilaterally. Muscle power within normal limits bilaterally.  Nails Thick disfigured discolored nails with subungual debris  from hallux to fifth toes bilaterally. No evidence of bacterial infection or drainage bilaterally.  Orthopedic  No limitations of motion  feet .  No crepitus or effusions noted.  No bony pathology or digital deformities noted.  HAV  B/L.  Hammer toes 2-5  B/l.  Skin  normotropic skin with no porokeratosis noted bilaterally.  No signs of infections or ulcers noted.  Callus sub 1st MPJ right foot.   Onychomycosis  Pain in right toes  Pain in left toes  Debride callus with # 15 blade right foot.  Consent was obtained for treatment procedures.   Mechanical debridement of nails 1-5  bilaterally performed with a nail nipper.  Filed with dremel without incident.  Debride callus with # 15 blade.   Return office visit    3 months                 Told patient to return for periodic foot care and evaluation due to potential at risk complications.   Gardiner Barefoot DPM

## 2020-12-11 ENCOUNTER — Encounter: Payer: Self-pay | Admitting: Podiatry

## 2020-12-11 ENCOUNTER — Other Ambulatory Visit: Payer: Self-pay

## 2020-12-11 ENCOUNTER — Ambulatory Visit (INDEPENDENT_AMBULATORY_CARE_PROVIDER_SITE_OTHER): Payer: Medicare Other | Admitting: Podiatry

## 2020-12-11 DIAGNOSIS — B351 Tinea unguium: Secondary | ICD-10-CM

## 2020-12-11 DIAGNOSIS — E119 Type 2 diabetes mellitus without complications: Secondary | ICD-10-CM | POA: Diagnosis not present

## 2020-12-11 DIAGNOSIS — M79675 Pain in left toe(s): Secondary | ICD-10-CM | POA: Diagnosis not present

## 2020-12-11 DIAGNOSIS — M79674 Pain in right toe(s): Secondary | ICD-10-CM

## 2020-12-11 NOTE — Progress Notes (Signed)
This patient returns to my office for at risk foot care.  This patient requires this care by a professional since this patient will be at risk due to having diabetes.  This patient is unable to cut nails and his callus  himself since the patient cannot reach his nails.These nails are painful walking and wearing shoes.  This patient presents for at risk foot care today.  General Appearance  Alert, conversant and in no acute stress.  Vascular  Dorsalis pedis and posterior tibial  pulses are palpable  bilaterally.  Capillary return is within normal limits  bilaterally. Temperature is within normal limits  bilaterally.  Neurologic  Senn-Weinstein monofilament wire test diminished   bilaterally. Muscle power within normal limits bilaterally.  Nails Thick disfigured discolored nails with subungual debris  from hallux to fifth toes bilaterally. No evidence of bacterial infection or drainage bilaterally.  Orthopedic  No limitations of motion  feet .  No crepitus or effusions noted.  No bony pathology or digital deformities noted.  HAV  B/L.  Hammer toes 2-5  B/l.  Skin  normotropic skin with no porokeratosis noted bilaterally.  No signs of infections or ulcers noted.  Callus sub 1st MPJ right foot.   Onychomycosis  Pain in right toes  Pain in left toes  Debride callus with # 15 blade right foot.  Consent was obtained for treatment procedures.   Mechanical debridement of nails 1-5  bilaterally performed with a nail nipper.  Filed with dremel without incident.  Debride callus with # 15 blade.   Return office visit    3 months                 Told patient to return for periodic foot care and evaluation due to potential at risk complications.   Gardiner Barefoot DPM

## 2021-03-19 ENCOUNTER — Encounter: Payer: Self-pay | Admitting: Podiatry

## 2021-03-19 ENCOUNTER — Other Ambulatory Visit: Payer: Self-pay

## 2021-03-19 ENCOUNTER — Ambulatory Visit (INDEPENDENT_AMBULATORY_CARE_PROVIDER_SITE_OTHER): Payer: Medicare Other

## 2021-03-19 ENCOUNTER — Ambulatory Visit (INDEPENDENT_AMBULATORY_CARE_PROVIDER_SITE_OTHER): Payer: Medicare Other | Admitting: Podiatry

## 2021-03-19 DIAGNOSIS — L989 Disorder of the skin and subcutaneous tissue, unspecified: Secondary | ICD-10-CM | POA: Diagnosis not present

## 2021-03-19 DIAGNOSIS — M79675 Pain in left toe(s): Secondary | ICD-10-CM

## 2021-03-19 DIAGNOSIS — E119 Type 2 diabetes mellitus without complications: Secondary | ICD-10-CM | POA: Diagnosis not present

## 2021-03-19 DIAGNOSIS — M79672 Pain in left foot: Secondary | ICD-10-CM

## 2021-03-19 DIAGNOSIS — S99922A Unspecified injury of left foot, initial encounter: Secondary | ICD-10-CM | POA: Diagnosis not present

## 2021-03-19 DIAGNOSIS — M79674 Pain in right toe(s): Secondary | ICD-10-CM | POA: Diagnosis not present

## 2021-03-19 DIAGNOSIS — B351 Tinea unguium: Secondary | ICD-10-CM

## 2021-03-19 NOTE — Progress Notes (Signed)
This patient returns to my office for at risk foot care.  This patient requires this care by a professional since this patient will be at risk due to having diabetes.  This patient is unable to cut nails and his callus  himself since the patient cannot reach his nails.These nails are painful walking and wearing shoes.  This patient also relates having an injury to the top of his left foot six weeks ago.  He has said it bled but has healed with a brown covering but his pain is sensitive.  He requests this lesion be evaluated.This patient presents for at risk foot care today.  General Appearance  Alert, conversant and in no acute stress.  Vascular  Dorsalis pedis and posterior tibial  pulses are palpable  bilaterally.  Capillary return is within normal limits  bilaterally. Temperature is within normal limits  bilaterally.  Neurologic  Senn-Weinstein monofilament wire test diminished   bilaterally. Muscle power within normal limits bilaterally.  Nails Thick disfigured discolored nails with subungual debris  from hallux to fifth toes bilaterally. No evidence of bacterial infection or drainage bilaterally.  Orthopedic  No limitations of motion  feet .  No crepitus or effusions noted.  No bony pathology or digital deformities noted.  HAV  B/L.  Hammer toes 2-5  B/l.  Skin  normotropic skin with no porokeratosis noted bilaterally.  No signs of infections or ulcers noted.  Callus sub 1st MPJ right foot. Skin lesion dorsally at 3,4 metabases left foot.  Onychomycosis  Pain in right toes  Pain in left toes  Skin lesion left foot.  Consent was obtained for treatment procedures.   Mechanical debridement of nails 1-5  bilaterally performed with a nail nipper.  Filed with dremel without incident.  Debride callus with # 15 blade. Dremel tool was used to debride the upper layers of skin.  Normal healing noted.  No redness or swelling or fluctuance noted left foot.  X-ray was taken and no evidence of fb noted.  Told  him to lace his shoes so that no pressure is put on the skin lesion.  Discussed possible biopsy if pain persists.   Return office visit    3 months                 Told patient to return for periodic foot care and evaluation due to potential at risk complications.   Gardiner Barefoot DPM

## 2021-03-20 ENCOUNTER — Other Ambulatory Visit: Payer: Self-pay | Admitting: Podiatry

## 2021-03-20 DIAGNOSIS — S99922A Unspecified injury of left foot, initial encounter: Secondary | ICD-10-CM

## 2021-06-26 ENCOUNTER — Other Ambulatory Visit: Payer: Self-pay

## 2021-06-26 ENCOUNTER — Encounter: Payer: Self-pay | Admitting: Podiatry

## 2021-06-26 ENCOUNTER — Ambulatory Visit (INDEPENDENT_AMBULATORY_CARE_PROVIDER_SITE_OTHER): Payer: Medicare Other | Admitting: Podiatry

## 2021-06-26 DIAGNOSIS — L84 Corns and callosities: Secondary | ICD-10-CM

## 2021-06-26 DIAGNOSIS — E119 Type 2 diabetes mellitus without complications: Secondary | ICD-10-CM | POA: Diagnosis not present

## 2021-06-26 DIAGNOSIS — B351 Tinea unguium: Secondary | ICD-10-CM

## 2021-06-26 DIAGNOSIS — M79675 Pain in left toe(s): Secondary | ICD-10-CM

## 2021-06-26 DIAGNOSIS — M79674 Pain in right toe(s): Secondary | ICD-10-CM

## 2021-06-26 DIAGNOSIS — N1831 Chronic kidney disease, stage 3a: Secondary | ICD-10-CM | POA: Diagnosis not present

## 2021-06-26 DIAGNOSIS — I6523 Occlusion and stenosis of bilateral carotid arteries: Secondary | ICD-10-CM | POA: Insufficient documentation

## 2021-06-26 NOTE — Progress Notes (Signed)
This patient returns to my office for at risk foot care.  This patient requires this care by a professional since this patient will be at risk due to having diabetes.  This patient is unable to cut nails and his callus  himself since the patient cannot reach his nails.These nails are painful walking and wearing shoes.  This patient also relates having an injury to the top of his left foot six weeks ago.  He has said it bled but has healed with a brown covering but his pain is sensitive.  He requests this lesion be evaluated.This patient presents for at risk foot care today.  General Appearance  Alert, conversant and in no acute stress.  Vascular  Dorsalis pedis and posterior tibial  pulses are palpable  bilaterally.  Capillary return is within normal limits  bilaterally. Temperature is within normal limits  bilaterally.  Neurologic  Senn-Weinstein monofilament wire test diminished   bilaterally. Muscle power within normal limits bilaterally.  Nails Thick disfigured discolored nails with subungual debris  from hallux to fifth toes bilaterally. No evidence of bacterial infection or drainage bilaterally.  Orthopedic  No limitations of motion  feet .  No crepitus or effusions noted.  No bony pathology or digital deformities noted.  HAV  B/L.  Hammer toes 2-5  B/l.  Skin  normotropic skin with no porokeratosis noted bilaterally.  No signs of infections or ulcers noted.  Callus sub 1st MPJ right foot. Skin lesion dorsally at 3,4 metabases left foot.  Onychomycosis  Pain in right toes  Pain in left toes  Skin lesion left foot.  Consent was obtained for treatment procedures.   Mechanical debridement of nails 1-5  bilaterally performed with a nail nipper.  Filed with dremel without incident.  Debride callus with dremel tool.  Callus is asymptomatic.   Return office visit    3 months                 Told patient to return for periodic foot care and evaluation due to potential at risk  complications.   Gardiner Barefoot DPM

## 2021-09-25 ENCOUNTER — Ambulatory Visit (INDEPENDENT_AMBULATORY_CARE_PROVIDER_SITE_OTHER): Payer: Medicare Other | Admitting: Podiatry

## 2021-09-25 ENCOUNTER — Encounter: Payer: Self-pay | Admitting: Podiatry

## 2021-09-25 DIAGNOSIS — B351 Tinea unguium: Secondary | ICD-10-CM

## 2021-09-25 DIAGNOSIS — M79675 Pain in left toe(s): Secondary | ICD-10-CM

## 2021-09-25 DIAGNOSIS — N1831 Chronic kidney disease, stage 3a: Secondary | ICD-10-CM

## 2021-09-25 DIAGNOSIS — M79674 Pain in right toe(s): Secondary | ICD-10-CM

## 2021-09-25 DIAGNOSIS — E119 Type 2 diabetes mellitus without complications: Secondary | ICD-10-CM | POA: Diagnosis not present

## 2021-09-25 NOTE — Progress Notes (Signed)
This patient returns to my office for at risk foot care.  This patient requires this care by a professional since this patient will be at risk due to having diabetes.  This patient is unable to cut nails and his callus  himself since the patient cannot reach his nails.These nails are painful walking and wearing shoes.  This patient presents for at risk foot care today.  General Appearance  Alert, conversant and in no acute stress.  Vascular  Dorsalis pedis and posterior tibial  pulses are palpable  bilaterally.  Capillary return is within normal limits  bilaterally. Temperature is within normal limits  bilaterally.  Neurologic  Senn-Weinstein monofilament wire test diminished   bilaterally. Muscle power within normal limits bilaterally.  Nails Thick disfigured discolored nails with subungual debris  from hallux to fifth toes bilaterally. No evidence of bacterial infection or drainage bilaterally.  Orthopedic  No limitations of motion  feet .  No crepitus or effusions noted.  No bony pathology or digital deformities noted.  HAV  B/L.  Hammer toes 2-5  B/l.  Skin  normotropic skin with no porokeratosis noted bilaterally.  No signs of infections or ulcers noted.  Callus sub 1st MPJ right foot asymptomatic  Onychomycosis  Pain in right toes  Pain in left toes    Consent was obtained for treatment procedures.   Mechanical debridement of nails 1-5  bilaterally performed with a nail nipper.  Filed with dremel without incident.    Return office visit    3 months                 Told patient to return for periodic foot care and evaluation due to potential at risk complications.   Laiken Sandy DPM  

## 2021-12-31 ENCOUNTER — Ambulatory Visit: Payer: Medicare Other | Admitting: Podiatry

## 2022-01-01 ENCOUNTER — Encounter: Payer: Self-pay | Admitting: Podiatry

## 2022-01-01 ENCOUNTER — Ambulatory Visit (INDEPENDENT_AMBULATORY_CARE_PROVIDER_SITE_OTHER): Payer: Medicare Other | Admitting: Podiatry

## 2022-01-01 DIAGNOSIS — N1831 Chronic kidney disease, stage 3a: Secondary | ICD-10-CM

## 2022-01-01 DIAGNOSIS — E119 Type 2 diabetes mellitus without complications: Secondary | ICD-10-CM

## 2022-01-01 DIAGNOSIS — L84 Corns and callosities: Secondary | ICD-10-CM | POA: Diagnosis not present

## 2022-01-01 DIAGNOSIS — B351 Tinea unguium: Secondary | ICD-10-CM | POA: Diagnosis not present

## 2022-01-01 DIAGNOSIS — M79674 Pain in right toe(s): Secondary | ICD-10-CM | POA: Diagnosis not present

## 2022-01-01 DIAGNOSIS — M79675 Pain in left toe(s): Secondary | ICD-10-CM | POA: Diagnosis not present

## 2022-01-01 NOTE — Progress Notes (Signed)
This patient returns to my office for at risk foot care.  This patient requires this care by a professional since this patient will be at risk due to having diabetes.  This patient is unable to cut nails and his callus  himself since the patient cannot reach his nails.These nails are painful walking and wearing shoes.  This patient presents for at risk foot care today.  General Appearance  Alert, conversant and in no acute stress.  Vascular  Dorsalis pedis and posterior tibial  pulses are palpable  bilaterally.  Capillary return is within normal limits  bilaterally. Temperature is within normal limits  bilaterally.  Neurologic  Senn-Weinstein monofilament wire test diminished   bilaterally. Muscle power within normal limits bilaterally.  Nails Thick disfigured discolored nails with subungual debris  from hallux to fifth toes bilaterally. No evidence of bacterial infection or drainage bilaterally.  Orthopedic  No limitations of motion  feet .  No crepitus or effusions noted.  No bony pathology or digital deformities noted.  HAV  B/L.  Hammer toes 2-5  B/l.  Skin  normotropic skin with no porokeratosis noted bilaterally.  No signs of infections or ulcers noted.  Callus sub 1st MPJ right foot symptomatic  Onychomycosis  Pain in right toes  Pain in left toes  Debride callus right foot with dremel tool.  Consent was obtained for treatment procedures.   Mechanical debridement of nails 1-5  bilaterally performed with a nail nipper.  Filed with dremel without incident.    Return office visit    3 months                 Told patient to return for periodic foot care and evaluation due to potential at risk complications.   Zoriah Pulice DPM  

## 2022-04-02 ENCOUNTER — Ambulatory Visit (INDEPENDENT_AMBULATORY_CARE_PROVIDER_SITE_OTHER): Payer: Medicare Other | Admitting: Podiatry

## 2022-04-02 ENCOUNTER — Encounter: Payer: Self-pay | Admitting: Podiatry

## 2022-04-02 DIAGNOSIS — M79675 Pain in left toe(s): Secondary | ICD-10-CM

## 2022-04-02 DIAGNOSIS — B351 Tinea unguium: Secondary | ICD-10-CM

## 2022-04-02 DIAGNOSIS — L84 Corns and callosities: Secondary | ICD-10-CM

## 2022-04-02 DIAGNOSIS — E119 Type 2 diabetes mellitus without complications: Secondary | ICD-10-CM | POA: Diagnosis not present

## 2022-04-02 DIAGNOSIS — N1831 Chronic kidney disease, stage 3a: Secondary | ICD-10-CM

## 2022-04-02 DIAGNOSIS — M79674 Pain in right toe(s): Secondary | ICD-10-CM

## 2022-04-02 NOTE — Progress Notes (Signed)
This patient returns to my office for at risk foot care.  This patient requires this care by a professional since this patient will be at risk due to having diabetes.  This patient is unable to cut nails and his callus  himself since the patient cannot reach his nails.These nails are painful walking and wearing shoes.  This patient presents for at risk foot care today.  General Appearance  Alert, conversant and in no acute stress.  Vascular  Dorsalis pedis and posterior tibial  pulses are palpable  bilaterally.  Capillary return is within normal limits  bilaterally. Temperature is within normal limits  bilaterally.  Neurologic  Senn-Weinstein monofilament wire test diminished   bilaterally. Muscle power within normal limits bilaterally.  Nails Thick disfigured discolored nails with subungual debris  from hallux to fifth toes bilaterally. No evidence of bacterial infection or drainage bilaterally.  Orthopedic  No limitations of motion  feet .  No crepitus or effusions noted.  No bony pathology or digital deformities noted.  HAV  B/L.  Hammer toes 2-5  B/l.  Skin  normotropic skin with no porokeratosis noted bilaterally.  No signs of infections or ulcers noted.  Callus sub 1st MPJ right foot symptomatic  Onychomycosis  Pain in right toes  Pain in left toes  Debride callus right foot with dremel tool.  Consent was obtained for treatment procedures.   Mechanical debridement of nails 1-5  bilaterally performed with a nail nipper.  Filed with dremel without incident.    Return office visit    3 months                 Told patient to return for periodic foot care and evaluation due to potential at risk complications.   Gardiner Barefoot DPM

## 2022-05-29 ENCOUNTER — Ambulatory Visit (INDEPENDENT_AMBULATORY_CARE_PROVIDER_SITE_OTHER): Payer: Medicare Other

## 2022-05-29 ENCOUNTER — Encounter: Payer: Self-pay | Admitting: Physician Assistant

## 2022-05-29 ENCOUNTER — Ambulatory Visit (INDEPENDENT_AMBULATORY_CARE_PROVIDER_SITE_OTHER): Payer: Medicare Other | Admitting: Physician Assistant

## 2022-05-29 DIAGNOSIS — M25561 Pain in right knee: Secondary | ICD-10-CM

## 2022-05-29 DIAGNOSIS — M25521 Pain in right elbow: Secondary | ICD-10-CM | POA: Diagnosis not present

## 2022-05-29 MED ORDER — BUPIVACAINE HCL 0.25 % IJ SOLN
2.0000 mL | INTRAMUSCULAR | Status: AC | PRN
  Administered 2022-05-29: 2 mL via INTRA_ARTICULAR

## 2022-05-29 MED ORDER — LIDOCAINE HCL 1 % IJ SOLN
2.0000 mL | INTRAMUSCULAR | Status: AC | PRN
  Administered 2022-05-29: 2 mL

## 2022-05-29 MED ORDER — METHYLPREDNISOLONE ACETATE 40 MG/ML IJ SUSP
80.0000 mg | INTRAMUSCULAR | Status: AC | PRN
  Administered 2022-05-29: 80 mg via INTRA_ARTICULAR
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
## 2022-07-08 ENCOUNTER — Ambulatory Visit: Payer: PRIVATE HEALTH INSURANCE | Admitting: Podiatry

## 2022-07-09 ENCOUNTER — Ambulatory Visit (INDEPENDENT_AMBULATORY_CARE_PROVIDER_SITE_OTHER): Payer: Medicare Other | Admitting: Podiatry

## 2022-07-09 ENCOUNTER — Encounter: Payer: Self-pay | Admitting: Podiatry

## 2022-07-09 DIAGNOSIS — M79674 Pain in right toe(s): Secondary | ICD-10-CM | POA: Diagnosis not present

## 2022-07-09 DIAGNOSIS — M79675 Pain in left toe(s): Secondary | ICD-10-CM | POA: Diagnosis not present

## 2022-07-09 DIAGNOSIS — E119 Type 2 diabetes mellitus without complications: Secondary | ICD-10-CM

## 2022-07-09 DIAGNOSIS — B351 Tinea unguium: Secondary | ICD-10-CM

## 2022-07-09 NOTE — Progress Notes (Signed)
This patient returns to my office for at risk foot care.  This patient requires this care by a professional since this patient will be at risk due to having diabetes.  This patient is unable to cut nails and his callus  himself since the patient cannot reach his nails.These nails are painful walking and wearing shoes.  This patient presents for at risk foot care today.  General Appearance  Alert, conversant and in no acute stress.  Vascular  Dorsalis pedis and posterior tibial  pulses are palpable  bilaterally.  Capillary return is within normal limits  bilaterally. Temperature is within normal limits  bilaterally.  Neurologic  Senn-Weinstein monofilament wire test diminished   bilaterally. Muscle power within normal limits bilaterally.  Nails Thick disfigured discolored nails with subungual debris  from hallux to fifth toes bilaterally. No evidence of bacterial infection or drainage bilaterally.  Orthopedic  No limitations of motion  feet .  No crepitus or effusions noted.  No bony pathology or digital deformities noted.  HAV  B/L.  Hammer toes 2-5  B/l.  Skin  normotropic skin with no porokeratosis noted bilaterally.  No signs of infections or ulcers noted.  Callus sub 1st MPJ right foot asymptomatic  Onychomycosis  Pain in right toes  Pain in left toes    Consent was obtained for treatment procedures.   Mechanical debridement of nails 1-5  bilaterally performed with a nail nipper.  Filed with dremel without incident.    Return office visit    3 months                 Told patient to return for periodic foot care and evaluation due to potential at risk complications.   Gardiner Barefoot DPM

## 2022-08-27 ENCOUNTER — Ambulatory Visit (INDEPENDENT_AMBULATORY_CARE_PROVIDER_SITE_OTHER): Payer: Medicare Other | Admitting: Physician Assistant

## 2022-08-27 ENCOUNTER — Other Ambulatory Visit (INDEPENDENT_AMBULATORY_CARE_PROVIDER_SITE_OTHER): Payer: Medicare Other

## 2022-08-27 ENCOUNTER — Encounter: Payer: Self-pay | Admitting: Physician Assistant

## 2022-08-27 DIAGNOSIS — M25511 Pain in right shoulder: Secondary | ICD-10-CM | POA: Diagnosis not present

## 2022-08-27 MED ORDER — METHYLPREDNISOLONE ACETATE 40 MG/ML IJ SUSP
80.0000 mg | INTRAMUSCULAR | Status: AC | PRN
Start: 2022-08-27 — End: 2022-08-27
  Administered 2022-08-27: 80 mg via INTRA_ARTICULAR

## 2022-08-27 MED ORDER — LIDOCAINE HCL 1 % IJ SOLN
2.0000 mL | INTRAMUSCULAR | Status: AC | PRN
Start: 2022-08-27 — End: 2022-08-27
  Administered 2022-08-27: 2 mL

## 2022-08-27 MED ORDER — BUPIVACAINE HCL 0.25 % IJ SOLN
2.0000 mL | INTRAMUSCULAR | Status: AC | PRN
  Administered 2022-08-27: 2 mL via INTRA_ARTICULAR

## 2022-08-27 NOTE — Progress Notes (Signed)
Office Visit Note   Patient: Gabriel Evans           Date of Birth: [birth year]           MRN: [id number] Visit Date: 08/27/2022              Requested by: Adrian Prince, MD [address] [place],  [place] [address] PCP: Adrian Prince, MD  Chief Complaint  Patient presents with  . Right Shoulder - Pain      HPI: Gabriel Evans is a pleasant 82 year old gentleman who I have seen in the past.  He comes in today complaining of right shoulder pain.  He does have peripheral neuropathy and has balance issues.  He also gets dizzy quite a bit.  He is followed by his primary care physician who thinks this may be secondary to an inner ear issue combined with his neuropathy.  He lost his balance a couple months ago and said he fell in the bathroom and hit his right shoulder on the commode.  He thought this would get better.  But he is having trouble sleeping on his right side because of shoulder pain.  He can raise his arm but it is painful.  He has been treating this with Voltaren gel and Tylenol ranks his pain at night is moderate plus and awakens him from sleep.  Assessment & Plan: Visit Diagnoses:  1. Acute pain of right shoulder     Plan: By exam I think he has irritated his rotator cuff.  He certainly does have a lot of arthrosis in the [facility] joint.  I do not see anything concerning for fracture or dislocation.  Talked about options.  He has had steroid injections into other joints in the past and thinks he has done well.  Would like to go forward with that.  I have also given him some exercises he could do at home.  Again encouraged him to continue to follow-up with his primary care physician regarding his balance issues he is already done physical therapy for this.  Follow-Up Instructions: No follow-ups on file.   Ortho Exam  Patient is alert, oriented, no adenopathy, well-dressed, normal affect, normal respiratory effort. Examination of his right shoulder he has good grip strength no pain  with range of motion of his neck.  He does have some prominence over the [facility] joint consistent with his arthritis.  He has active forward elevation though it takes him a little bit because it is painful.  Reproduces pain with internal rotation behind the back.  Also has some pain with external rotation strength is fairly good with 5 out of 5 strength.  No paresthesias no bruising  Imaging: XR Shoulder Right  Result Date: 08/27/2022 Three-view radiographs of his right shoulder were obtained today.  He has some early degenerative changes of the glenohumeral joint but no elevation of the humeral head no fracture noted.  He does have significant arthritis at the [facility] joint with hypertrophic bone formation especially superiorly  No images are attached to the encounter.  Labs: No results found for: "HGBA1C", "ESRSEDRATE", "CRP", "LABURIC", "REPTSTATUS", "GRAMSTAIN", "CULT", "LABORGA"   Lab Results  Component Value Date   ALBUMIN 4.0 03/30/2009    No results found for: "MG" No results found for: "VD25OH"  No results found for: "PREALBUMIN"    Latest Ref Rng & Units 04/06/2009    5:10 AM 04/05/2009    5:08 AM 04/04/2009    5:17 AM  CBC EXTENDED  WBC  4.0 - 10.5 K/uL 6.6  8.3  10.1   RBC 4.22 - 5.81 MIL/uL 2.69  2.84  3.76   Hemoglobin 13.0 - 17.0 g/dL 8.9  9.3 DELTA CHECK NOTED  12.4   HCT 39.0 - 52.0 % 25.3  26.6  35.4   Platelets 150 - 400 K/uL 160  166 DELTA CHECK NOTED  225      There is no height or weight on file to calculate BMI.  Orders:  Orders Placed This Encounter  Procedures  . XR Shoulder Right   No orders of the defined types were placed in this encounter.    Procedures: Large Joint Inj: R subacromial bursa on 08/27/2022 2:30 PM Indications: diagnostic evaluation and pain Details: 25 G 1.5 in needle  Arthrogram: No  Medications: 2 mL lidocaine 1 %; 80 mg methylPREDNISolone acetate 40 MG/ML; 2 mL bupivacaine 0.25 % Outcome: tolerated well, no immediate  complications Procedure, treatment alternatives, risks and benefits explained, specific risks discussed. Consent was given by the patient.    Clinical Data: No additional findings.  ROS:  All other systems negative, except as noted in the HPI. Review of Systems  Objective: Vital Signs: There were no vitals taken for this visit.  Specialty Comments:  No specialty comments available.  PMFS History: Patient Active Problem List   Diagnosis Date Noted  . Pain in right elbow 05/29/2022  . Pain in right knee 05/29/2022  . Occlusion and stenosis of bilateral carotid arteries 06/26/2021  . Diabetes 03/19/2021  . Skin lesion 03/19/2021  . Callus 02/28/2020  . Chronic kidney disease, stage 3a 04/07/2019  . Insomnia 04/07/2019  . Benign prostatic hyperplasia with lower urinary tract symptoms 12/03/2017  . Chronic obstructive pulmonary disease 04/29/2017  . Abnormal gait 02/03/2017  . Anxiety disorder 02/03/2017  . Benign neoplasm of colon 02/03/2017  . Hyperlipidemia 02/03/2017  . Polyneuropathy due to type 2 diabetes mellitus 02/03/2017  . Postoperative hypothyroidism 02/03/2017   Past Medical History:  Diagnosis Date  . Allergy   . Cataract   . Diabetes mellitus without complication   . Thyroid disease     History reviewed. No pertinent family history.  Past Surgical History:  Procedure Laterality Date  . EYE SURGERY    . FRACTURE SURGERY    . JOINT REPLACEMENT    . VASECTOMY     Social History   Occupational History  . Not on file  Tobacco Use  . Smoking status: Every Day    Types: Pipe, Cigars  . Smokeless tobacco: Never  Substance and Sexual Activity  . Alcohol use: Never  . Drug use: Never  . Sexual activity: Not on file

## 2022-09-23 ENCOUNTER — Encounter: Payer: Self-pay | Admitting: Podiatry

## 2022-10-09 ENCOUNTER — Ambulatory Visit: Payer: Medicare Other | Admitting: Podiatry

## 2022-10-13 ENCOUNTER — Encounter: Payer: Self-pay | Admitting: Podiatry

## 2022-10-13 ENCOUNTER — Ambulatory Visit (INDEPENDENT_AMBULATORY_CARE_PROVIDER_SITE_OTHER): Payer: Medicare Other | Admitting: Podiatry

## 2022-10-13 DIAGNOSIS — B351 Tinea unguium: Secondary | ICD-10-CM

## 2022-10-13 DIAGNOSIS — M79674 Pain in right toe(s): Secondary | ICD-10-CM

## 2022-10-13 DIAGNOSIS — E119 Type 2 diabetes mellitus without complications: Secondary | ICD-10-CM

## 2022-10-13 DIAGNOSIS — M79675 Pain in left toe(s): Secondary | ICD-10-CM

## 2022-10-13 NOTE — Progress Notes (Signed)
This patient returns to my office for at risk foot care.  This patient requires this care by a professional since this patient will be at risk due to having diabetes.  This patient is unable to cut nails and his callus  himself since the patient cannot reach his nails.These nails are painful walking and wearing shoes.  This patient presents for at risk foot care today.  General Appearance  Alert, conversant and in no acute stress.  Vascular  Dorsalis pedis and posterior tibial  pulses are palpable  bilaterally.  Capillary return is within normal limits  bilaterally. Temperature is within normal limits  bilaterally.  Neurologic  Senn-Weinstein monofilament wire test diminished   bilaterally. Muscle power within normal limits bilaterally.  Nails Thick disfigured discolored nails with subungual debris  from hallux to fifth toes bilaterally. No evidence of bacterial infection or drainage bilaterally.  Orthopedic  No limitations of motion  feet .  No crepitus or effusions noted.  No bony pathology or digital deformities noted.  HAV  B/L.  Hammer toes 2-5  B/l.  Skin  normotropic skin with no porokeratosis noted bilaterally.  No signs of infections or ulcers noted.  Callus sub 1st MPJ  and sub 3 right foot asymptomatic  Onychomycosis  Pain in right toes  Pain in left toes    Consent was obtained for treatment procedures.   Mechanical debridement of nails 1-5  bilaterally performed with a nail nipper.  Filed with dremel without incident.    Return office visit    3 months                 Told patient to return for periodic foot care and evaluation due to potential at risk complications.   Helane Gunther DPM

## 2023-01-13 ENCOUNTER — Ambulatory Visit (INDEPENDENT_AMBULATORY_CARE_PROVIDER_SITE_OTHER): Payer: Medicare Other | Admitting: Podiatry

## 2023-01-13 ENCOUNTER — Encounter: Payer: Self-pay | Admitting: Podiatry

## 2023-01-13 DIAGNOSIS — E119 Type 2 diabetes mellitus without complications: Secondary | ICD-10-CM | POA: Diagnosis not present

## 2023-01-13 DIAGNOSIS — L84 Corns and callosities: Secondary | ICD-10-CM

## 2023-01-13 DIAGNOSIS — M79675 Pain in left toe(s): Secondary | ICD-10-CM | POA: Diagnosis not present

## 2023-01-13 DIAGNOSIS — M79674 Pain in right toe(s): Secondary | ICD-10-CM

## 2023-01-13 DIAGNOSIS — L989 Disorder of the skin and subcutaneous tissue, unspecified: Secondary | ICD-10-CM

## 2023-01-13 DIAGNOSIS — B351 Tinea unguium: Secondary | ICD-10-CM | POA: Diagnosis not present

## 2023-01-13 NOTE — Progress Notes (Signed)
This patient returns to my office for at risk foot care.  This patient requires this care by a professional since this patient will be at risk due to having diabetes.  This patient is unable to cut nails and his callus  himself since the patient cannot reach his nails.These nails are painful walking and wearing shoes.  This patient presents for at risk foot care today.  General Appearance  Alert, conversant and in no acute stress.  Vascular  Dorsalis pedis and posterior tibial  pulses are palpable  bilaterally.  Capillary return is within normal limits  bilaterally. Temperature is within normal limits  bilaterally.  Neurologic  Senn-Weinstein monofilament wire test diminished   bilaterally. Muscle power within normal limits bilaterally.  Nails Thick disfigured discolored nails with subungual debris  from hallux to fifth toes bilaterally. No evidence of bacterial infection or drainage bilaterally.  Orthopedic  No limitations of motion  feet .  No crepitus or effusions noted.  No bony pathology or digital deformities noted.  HAV  B/L.  Hammer toes 2-5  B/l.  Skin  normotropic skin with no porokeratosis noted bilaterally.  No signs of infections or ulcers noted.  Callus sub 1st MPJ  and sub 3 right foot asymptomatic  Onychomycosis  Pain in right toes  Pain in left toes    Consent was obtained for treatment procedures.   Mechanical debridement of nails 1-5  bilaterally performed with a nail nipper.  Filed with dremel without incident.    Return office visit    3 months                 Told patient to return for periodic foot care and evaluation due to potential at risk complications.   Helane Gunther DPM

## 2023-04-14 ENCOUNTER — Encounter: Payer: Self-pay | Admitting: Podiatry

## 2023-04-14 ENCOUNTER — Ambulatory Visit (INDEPENDENT_AMBULATORY_CARE_PROVIDER_SITE_OTHER): Payer: Medicare Other | Admitting: Podiatry

## 2023-04-14 DIAGNOSIS — M79674 Pain in right toe(s): Secondary | ICD-10-CM

## 2023-04-14 DIAGNOSIS — B351 Tinea unguium: Secondary | ICD-10-CM | POA: Diagnosis not present

## 2023-04-14 DIAGNOSIS — E119 Type 2 diabetes mellitus without complications: Secondary | ICD-10-CM

## 2023-04-14 DIAGNOSIS — M79675 Pain in left toe(s): Secondary | ICD-10-CM | POA: Diagnosis not present

## 2023-04-14 NOTE — Progress Notes (Signed)
This patient returns to my office for at risk foot care.  This patient requires this care by a professional since this patient will be at risk due to having diabetes.  This patient is unable to cut nails and his callus  himself since the patient cannot reach his nails.These nails are painful walking and wearing shoes.  This patient presents for at risk foot care today.  General Appearance  Alert, conversant and in no acute stress.  Vascular  Dorsalis pedis and posterior tibial  pulses are palpable  bilaterally.  Capillary return is within normal limits  bilaterally. Temperature is within normal limits  bilaterally.  Neurologic  Senn-Weinstein monofilament wire test diminished   bilaterally. Muscle power within normal limits bilaterally.  Nails Thick disfigured discolored nails with subungual debris  from hallux to fifth toes bilaterally. No evidence of bacterial infection or drainage bilaterally.  Orthopedic  No limitations of motion  feet .  No crepitus or effusions noted.  No bony pathology or digital deformities noted.  HAV  B/L.  Hammer toes 2-5  B/l.  Skin  normotropic skin with no porokeratosis noted bilaterally.  No signs of infections or ulcers noted.  Callus sub 1st MPJ  and sub 3 right foot asymptomatic  Onychomycosis  Pain in right toes  Pain in left toes    Consent was obtained for treatment procedures.   Mechanical debridement of nails 1-5  bilaterally performed with a nail nipper.  Filed with dremel without incident.    Return office visit    3 months                 Told patient to return for periodic foot care and evaluation due to potential at risk complications.   Helane Gunther DPM

## 2023-07-15 ENCOUNTER — Ambulatory Visit (INDEPENDENT_AMBULATORY_CARE_PROVIDER_SITE_OTHER): Payer: Medicare Other | Admitting: Podiatry

## 2023-07-15 ENCOUNTER — Encounter: Payer: Self-pay | Admitting: Podiatry

## 2023-07-15 DIAGNOSIS — M79674 Pain in right toe(s): Secondary | ICD-10-CM

## 2023-07-15 DIAGNOSIS — B351 Tinea unguium: Secondary | ICD-10-CM | POA: Diagnosis not present

## 2023-07-15 DIAGNOSIS — L989 Disorder of the skin and subcutaneous tissue, unspecified: Secondary | ICD-10-CM

## 2023-07-15 DIAGNOSIS — M79675 Pain in left toe(s): Secondary | ICD-10-CM | POA: Diagnosis not present

## 2023-07-15 DIAGNOSIS — E119 Type 2 diabetes mellitus without complications: Secondary | ICD-10-CM

## 2023-07-15 NOTE — Progress Notes (Signed)
 This patient returns to my office for at risk foot care.  This patient requires this care by a professional since this patient will be at risk due to having diabetes.  This patient is unable to cut nails and his callus  himself since the patient cannot reach his nails.These nails are painful walking and wearing shoes.  This patient presents for at risk foot care today.  General Appearance  Alert, conversant and in no acute stress.  Vascular  Dorsalis pedis and posterior tibial  pulses are palpable  bilaterally.  Capillary return is within normal limits  bilaterally. Temperature is within normal limits  bilaterally.  Neurologic  Senn-Weinstein monofilament wire test diminished   bilaterally. Muscle power within normal limits bilaterally.  Nails Thick disfigured discolored nails with subungual debris  from hallux to fifth toes bilaterally. No evidence of bacterial infection or drainage bilaterally.  Orthopedic  No limitations of motion  feet .  No crepitus or effusions noted.  No bony pathology or digital deformities noted.  HAV  B/L.  Hammer toes 2-5  B/l.  Skin  normotropic skin with no porokeratosis noted bilaterally.  No signs of infections or ulcers noted.  Callus sub 1st MPJ  and sub 3 right foot symptomatic  Onychomycosis  Pain in right toes  Pain in left toes  Porokeratosis sub 3 right foot.  Consent was obtained for treatment procedures.   Mechanical debridement of nails 1-5  bilaterally performed with a nail nipper.  Filed with dremel without incident. Debride callus with dremel tool.   Return office visit    3 months                 Told patient to return for periodic foot care and evaluation due to potential at risk complications.   Helane Gunther DPM

## 2023-10-19 ENCOUNTER — Ambulatory Visit (INDEPENDENT_AMBULATORY_CARE_PROVIDER_SITE_OTHER): Admitting: Podiatry

## 2023-10-19 ENCOUNTER — Encounter: Payer: Self-pay | Admitting: Podiatry

## 2023-10-19 DIAGNOSIS — M79675 Pain in left toe(s): Secondary | ICD-10-CM

## 2023-10-19 DIAGNOSIS — M79674 Pain in right toe(s): Secondary | ICD-10-CM | POA: Diagnosis not present

## 2023-10-19 DIAGNOSIS — E119 Type 2 diabetes mellitus without complications: Secondary | ICD-10-CM

## 2023-10-19 DIAGNOSIS — B351 Tinea unguium: Secondary | ICD-10-CM | POA: Diagnosis not present

## 2023-10-19 DIAGNOSIS — N1831 Chronic kidney disease, stage 3a: Secondary | ICD-10-CM

## 2023-10-19 NOTE — Progress Notes (Signed)
 This patient returns to my office for at risk foot care.  This patient requires this care by a professional since this patient will be at risk due to having diabetes.  This patient is unable to cut nails  himself since the patient cannot reach his nails.These nails are painful walking and wearing shoes.  He has history of injuring his right big toe two months ago.This patient presents for at risk foot care today.  General Appearance  Alert, conversant and in no acute stress.  Vascular  Dorsalis pedis and posterior tibial  pulses are palpable  bilaterally.  Capillary return is within normal limits  bilaterally. Temperature is within normal limits  bilaterally.  Neurologic  Senn-Weinstein monofilament wire test diminished   bilaterally. Muscle power within normal limits bilaterally.  Nails Thick disfigured discolored nails with subungual debris  from hallux to fifth toes bilaterally. No evidence of bacterial infection or drainage bilaterally.  Orthopedic  No limitations of motion  feet .  No crepitus or effusions noted.  No bony pathology or digital deformities noted.  HAV  B/L.  Hammer toes 2-5  B/l. Limited ROM IPJ right hallux  Skin  normotropic skin with no porokeratosis noted bilaterally.  No signs of infections or ulcers noted.  Callus sub 1st MPJ  and sub 3 right foot symptomatic  Onychomycosis  Pain in right toes  Pain in left toes    Consent was obtained for treatment procedures.   Mechanical debridement of nails 1-5  bilaterally performed with a nail nipper.  Filed with dremel without incident.   Return office visit    3 months                 Told patient to return for periodic foot care and evaluation due to potential at risk complications.   Ruffin Cotton DPM

## 2023-10-27 ENCOUNTER — Other Ambulatory Visit (INDEPENDENT_AMBULATORY_CARE_PROVIDER_SITE_OTHER): Payer: Self-pay

## 2023-10-27 ENCOUNTER — Ambulatory Visit (INDEPENDENT_AMBULATORY_CARE_PROVIDER_SITE_OTHER): Admitting: Physician Assistant

## 2023-10-27 ENCOUNTER — Encounter: Payer: Self-pay | Admitting: Physician Assistant

## 2023-10-27 DIAGNOSIS — G8929 Other chronic pain: Secondary | ICD-10-CM

## 2023-10-27 DIAGNOSIS — M25561 Pain in right knee: Secondary | ICD-10-CM

## 2023-10-27 NOTE — Progress Notes (Addendum)
 Office Visit Note   Patient: Gabriel Evans           Date of Birth: [birth year]           MRN: [id number] Visit Date: 10/27/2023              Requested by: Nichole Senior, MD [address] [place],  [place] [address] PCP: Nichole Senior, MD      HPI: Patient is an 83 year old gentleman who comes in today with a chief complaint of right knee and hamstring pain.  He said this was after a fall several weeks ago.  I have injected his knee before for arthritis.  He has complaints of chronic falling.  He said he is discussed this with his primary care doctor has had neurology referrals.  He denies any fever  feels like his knee gives out on him.  Assessment & Plan: Visit Diagnoses:  1. Chronic pain of right knee     Plan: I had a long talk with Ron.  With regards to an orthopedic standpoint I would like him to continue to use his cane but also to work with physical therapy on the right side for gait training and fall prevention.  He does have an upcoming visit with his primary care on July 8.  I suggested trying to get in sooner because of his progressive weakness.  Should go to the emergency room if it gets worse.  No apparent neurologic deficits today I I have told him this in the past to follow-up with his primary care he says he has in his blood work and his primary care.  And has also had a neurologic workup where he was told he had a polyneuropathy.  This could be coming from his back that would not really radicular in nature.  Has seen Dr. Eldonna in the remote past  Follow-Up Instructions: Return in about 3 weeks (around 11/17/2023).   Ortho Exam  Patient is alert, oriented, no adenopathy, well-dressed, normal affect, normal respiratory effort. Examination of his right knee no effusion no erythema compartments are soft and nontender negative Homans' sign no evidence of infection he has good dorsiflexion plantarflexion of his ankle he can hold extension on his knee.  Does have some  medial knee pain.  Compartments are soft no tenderness along the hamstrings    Imaging: XR KNEE 3 VIEW RIGHT Result Date: 10/27/2023 Radiographs of his right knee degenerative changes no acute fractures noted  No images are attached to the encounter.  Labs: No results found for: HGBA1C, ESRSEDRATE, CRP, LABURIC, REPTSTATUS, GRAMSTAIN, CULT, LABORGA   Lab Results  Component Value Date   ALBUMIN 4.0 03/30/2009    No results found for: MG No results found for: VD25OH  No results found for: PREALBUMIN    Latest Ref Rng & Units 04/06/2009    5:10 AM 04/05/2009    5:08 AM 04/04/2009    5:17 AM  CBC EXTENDED  WBC 4.0 - 10.5 K/uL 6.6  8.3  10.1   RBC 4.22 - 5.81 MIL/uL 2.69  2.84  3.76   Hemoglobin 13.0 - 17.0 g/dL 8.9  9.3 DELTA CHECK NOTED  12.4   HCT 39.0 - 52.0 % 25.3  26.6  35.4   Platelets 150 - 400 K/uL 160  166 DELTA CHECK NOTED  225      There is no height or weight on file to calculate BMI.  Orders:  Orders Placed This Encounter  Procedures   XR KNEE  3 VIEW RIGHT   No orders of the defined types were placed in this encounter.    Procedures: No procedures performed  Clinical Data: No additional findings.  ROS:  All other systems negative, except as noted in the HPI. Review of Systems  Objective: Vital Signs: There were no vitals taken for this visit.  Specialty Comments:  No specialty comments available.  PMFS History: Patient Active Problem List   Diagnosis Date Noted   Pain in right elbow 05/29/2022   Pain in right knee 05/29/2022   Occlusion and stenosis of bilateral carotid arteries 06/26/2021   Diabetes (HCC) 03/19/2021   Skin lesion 03/19/2021   Callus 02/28/2020   Chronic kidney disease, stage 3a (HCC) 04/07/2019   Insomnia 04/07/2019   Benign prostatic hyperplasia with lower urinary tract symptoms 12/03/2017   Chronic obstructive pulmonary disease (HCC) 04/29/2017   Abnormal gait 02/03/2017   Anxiety disorder  02/03/2017   Benign neoplasm of colon 02/03/2017   Hyperlipidemia 02/03/2017   Polyneuropathy due to type 2 diabetes mellitus (HCC) 02/03/2017   Postoperative hypothyroidism 02/03/2017   Past Medical History:  Diagnosis Date   Allergy    Cataract    Diabetes mellitus without complication (HCC)    Thyroid  disease     No family history on file.  Past Surgical History:  Procedure Laterality Date   EYE SURGERY     FRACTURE SURGERY     JOINT REPLACEMENT     VASECTOMY     Social History   Occupational History   Not on file  Tobacco Use   Smoking status: Every Day    Types: Pipe, Cigars   Smokeless tobacco: Never  Vaping Use   Vaping status: Never Used  Substance and Sexual Activity   Alcohol use: Never   Drug use: Never   Sexual activity: Not on file

## 2023-10-27 NOTE — Addendum Note (Signed)
 Addended by: RODGERS LACY on: 10/27/2023 04:17 PM   Modules accepted: Orders

## 2023-11-09 ENCOUNTER — Encounter: Payer: Self-pay | Admitting: Physical Therapy

## 2023-11-09 ENCOUNTER — Ambulatory Visit (INDEPENDENT_AMBULATORY_CARE_PROVIDER_SITE_OTHER): Admitting: Physical Therapy

## 2023-11-09 DIAGNOSIS — R29898 Other symptoms and signs involving the musculoskeletal system: Secondary | ICD-10-CM

## 2023-11-09 DIAGNOSIS — M25561 Pain in right knee: Secondary | ICD-10-CM

## 2023-11-09 DIAGNOSIS — M6281 Muscle weakness (generalized): Secondary | ICD-10-CM

## 2023-11-09 DIAGNOSIS — R296 Repeated falls: Secondary | ICD-10-CM | POA: Diagnosis not present

## 2023-11-09 DIAGNOSIS — R2681 Unsteadiness on feet: Secondary | ICD-10-CM | POA: Diagnosis not present

## 2023-11-09 DIAGNOSIS — G8929 Other chronic pain: Secondary | ICD-10-CM

## 2023-11-09 DIAGNOSIS — R2689 Other abnormalities of gait and mobility: Secondary | ICD-10-CM | POA: Diagnosis not present

## 2023-11-09 NOTE — Therapy (Addendum)
 OUTPATIENT PHYSICAL THERAPY LOWER EXTREMITY EVALUATION   Patient Name: Gabriel Evans MRN: [id number] DOB:04-Nov-1940, 83 y.o., male Today's Date: 11/09/2023  END OF SESSION:  PT End of Session - 11/09/23 1500     Visit Number 1    Number of Visits 25    Date for PT Re-Evaluation 02/04/24    Authorization Type MEDICARE AND [employer]    Progress Note Due on Visit 10    PT Start Time 1300    PT Stop Time 1347    PT Time Calculation (min) 47 min    Activity Tolerance Patient tolerated treatment well    Behavior During Therapy WFL for tasks assessed/performed          Past Medical History:  Diagnosis Date   Allergy    Cataract    Diabetes mellitus without complication (HCC)    Thyroid  disease    Past Surgical History:  Procedure Laterality Date   EYE SURGERY     FRACTURE SURGERY     JOINT REPLACEMENT     VASECTOMY     Patient Active Problem List   Diagnosis Date Noted   Pain in right elbow 05/29/2022   Pain in right knee 05/29/2022   Occlusion and stenosis of bilateral carotid arteries 06/26/2021   Diabetes (HCC) 03/19/2021   Skin lesion 03/19/2021   Callus 02/28/2020   Chronic kidney disease, stage 3a (HCC) 04/07/2019   Insomnia 04/07/2019   Benign prostatic hyperplasia with lower urinary tract symptoms 12/03/2017   Chronic obstructive pulmonary disease (HCC) 04/29/2017   Abnormal gait 02/03/2017   Anxiety disorder 02/03/2017   Benign neoplasm of colon 02/03/2017   Hyperlipidemia 02/03/2017   Polyneuropathy due to type 2 diabetes mellitus (HCC) 02/03/2017   Postoperative hypothyroidism 02/03/2017    PCP: Nichole Senior, MD   REFERRING PROVIDER: Persons, Ronal Dragon, PA   REFERRING DIAG: [phone] (ICD-10-CM) - Chronic pain of right knee   THERAPY DIAG:  Unsteadiness on feet  Repeated falls  Muscle weakness (generalized)  Other abnormalities of gait and mobility  Chronic pain of right knee  Other symptoms and signs involving the musculoskeletal  system  Rationale for Evaluation and Treatment: Rehabilitation  ONSET DATE: 10/27/23 PA referral to PT  SUBJECTIVE:   SUBJECTIVE STATEMENT: Patient on 10/27/2023, reported to PA with chronic R knee and hamstring pain. Patient has history of falls and R sided weakness. He had a neurologic work up that indicated polyneuropathy. Patient reports to PT today after an initial fall estimated 8 weeks ago in which he landed on his R knee. He believes it occurred due to broken toe when stepping out of room. 4 weeks after the initial fall, he fell again on his R knee after his toe got caught when standing up from chair. His most recent fall was last night when he turned off the lights, turned around, fell and scrapped his elbow. When he stands up, he hears noise from his leg and feels unstable initially until he stands and walks. Patient notices his R knee doesn't hold his weight and reports symptoms of buckling.   PERTINENT HISTORY: Occlusion and stenosis of bilateral carotid arteries, diabetes, polyneuropathy, chronic kidney disease stage 3a, Benign prostatic hyperplasia with lower urinary tract symptoms, COPD, Benign neoplasm of colon, Cataract, Joint replacement PAIN:  Are you having pain? Yes: NPRS scale: 5/10 when initially standing Pain location: hamstring and calf Pain description: tightness and racheting Aggravating factors: initially standing up  Relieving factors: tylenol and taking a few steps  PRECAUTIONS: Fall  RED FLAGS: None   WEIGHT BEARING RESTRICTIONS: No  FALLS:  Has patient fallen in last 6 months? Yes. Number of falls 2-3x a month, injuries include broken toe, scrap on elbow, black eye  LIVING ENVIRONMENT: Lives with: lives with their spouse who has disabilities  Lives in: [place], 2 story with master bedroom and bathroom downstairs  Stairs: Yes: Internal: 8+8 steps; on right going up and External: 4-5 steps; on right going up Has following equipment at home: Single point  cane, Environmental consultant - 2 wheeled, Tour manager, and Grab bars  OCCUPATION: retired   PLOF: Independent  He reports repetitive falls   PATIENT GOALS:  Get rid of R knee pain. Improve strength and balance to decrease fall frequency   NEXT MD VISIT:  11/17/2023   OBJECTIVE:  DIAGNOSTIC FINDINGS:  10/27/2023, Radiographs of his R knee show degenerative changes with no acute fractures noted   Patient-Specific Activity Scoring Scheme  0 represents "unable to perform." 10 represents "able to perform at prior level. 0 1 2 3 4 5 6 7 8 9  10 (Date and Score)   Activity Eval     1. ADLs 5    2. Walking 5    3. Prevent falls 5   4.    5.    Score 5    Total score = sum of the activity scores/number of activities Minimum detectable change (90%CI) for average score = 2 points Minimum detectable change (90%CI) for single activity score = 3 points  COGNITION: Overall cognitive status: WFL    SENSATION: Patient states no sensation in his feet due to diabetes  MUSCLE LENGTH Hamstring (hip position at 90* and knee extending) R: - 38* L: -48*  POSTURE: rounded shoulders and forward head  LOWER EXTREMITY ROM:   ROM Right eval Left eval  Hip flexion    Hip extension    Hip abduction    Hip adduction    Hip internal rotation    Hip external rotation    Knee flexion    Knee extension    Ankle dorsiflexion P: - 6 P: - 8  Ankle plantarflexion    Ankle inversion    Ankle eversion     (Blank rows = not tested)  LOWER EXTREMITY MMT:  MMT Right eval Left eval  Hip flexion    Hip extension    Hip abduction    Hip adduction    Hip internal rotation    Hip external rotation    Knee flexion    Knee extension 5/5 5/5  Ankle dorsiflexion    Ankle plantarflexion    Ankle inversion    Ankle eversion     (Blank rows = not tested)   FUNCTIONAL TESTS:  Berg Balance 21/56  [facility] PT Assessment - 11/09/23 1328       Standardized Balance Assessment   Standardized Balance Assessment  Berg Balance Test      Berg Balance Test   Sit to Stand Needs minimal aid to stand or to stabilize    Standing Unsupported Able to stand 2 minutes with supervision    Sitting with Back Unsupported but Feet Supported on Floor or Stool Able to sit safely and securely 2 minutes    Stand to Sit Controls descent by using hands    Transfers Able to transfer safely, definite need of hands    Standing Unsupported with Eyes Closed Needs help to keep from falling    Standing Unsupported with Feet Together  Needs help to attain position but able to stand for 30 seconds with feet together    From Standing, Reach Forward with Outstretched Arm Reaches forward but needs supervision    From Standing Position, Pick up Object from Floor Able to pick up shoe, needs supervision    From Standing Position, Turn to Look Behind Over each Shoulder Needs supervision when turning    Turn 360 Degrees Needs close supervision or verbal cueing    Standing Unsupported, Alternately Place Feet on Step/Stool Needs assistance to keep from falling or unable to try    Standing Unsupported, One Foot in [employer] balance while stepping or standing    Standing on One Leg Unable to try or needs assist to prevent fall    Total Score 21          GAIT Gait pattern: excessive arm swing for counterbalance BUEs, step through pattern, decreased step length- Right, decreased step length- Left, decreased stride length, decreased hip/knee flexion- Right, decreased hip/knee flexion- Left, knee flexed in stance- Right, lateral lean- Left, trunk flexed, and wide BOS Distance walked: 100' x 2 Assistive device utilized: None Level of assistance: SBA / verbal cues needed for gait deviations  TODAY'S TREATMENT                                                                          DATE:  PATIENT EDUCATION:  Education details: POC Person educated: Patient Education method: Programmer, multimedia, Verbal cues Education comprehension: verbalized  understanding  HOME EXERCISE PROGRAM:   ASSESSMENT: CLINICAL IMPRESSION: Patient is a 83 y.o. who presents to clinic with complaints of R knee pain with mobility, balance, and strength deficits that impair their ability to perform usual daily and recreational functional activities without increase difficulty/symptoms at this time. He presents with limited hamstring flexibility which may be causing his knee buckling sensation. Patient's 21/56 score on the BERG indicates risk of falls. During BERG balance test, patient had difficulty with narrow BOS, eyes closed activities, and SLS. He fatigued during standing while completing the BERG. Patient uses UE to maintain balance during activities, transfers, and with BOS changes. Patient has had recurrent falls with significant injuries. Improvement in balance, strength, and endurance may decrease his fall frequency. Patient to benefit from skilled PT services to address impairments and limitations to improve to previous level of function without restriction secondary to condition.   OBJECTIVE IMPAIRMENTS: Abnormal gait, cardiopulmonary status limiting activity, decreased balance, decreased endurance, decreased knowledge of use of DME, decreased mobility, decreased ROM, decreased strength, impaired flexibility, impaired sensation, postural dysfunction, and pain.   ACTIVITY LIMITATIONS: carrying, lifting, standing, stairs, transfers, and locomotion level  PARTICIPATION LIMITATIONS: meal prep, cleaning, laundry, community activity, and yard work  PERSONAL FACTORS: Age, Fitness, Past/current experiences, Time since onset of injury/illness/exacerbation, and 3+ comorbidities: see PMH are also affecting patient's functional outcome.   REHAB POTENTIAL: Good  CLINICAL DECISION MAKING: Evolving/moderate complexity  EVALUATION COMPLEXITY: Moderate   GOALS: Goals reviewed with patient? Yes  SHORT TERM GOALS: target date for Short term goals 12/07/23   1.   Patient will demonstrate independent use of home exercise program to maintain progress from in clinic treatments.  Baseline: See objective data Goal status: Initial  2. Assess TUG and set goals  Baseline: See objective data Goal status: Initial  3. BERG score 25/56 Baseline: See objective data Goal status: Initial  LONG TERM GOALS: target dates for all long term goals 02/04/24   1. Patient will demonstrate/report pain at worst less than or equal to 2/10 to facilitate minimal limitation in daily activity secondary to pain symptoms. Baseline: See objective data Goal status: Initial   2. Patient will demonstrate independent use of home exercise program to facilitate ability to maintain/progress functional gains from skilled physical therapy services. Baseline: See objective data Goal status: Initial  3.  BERG score >/= 30/56 for MDIC  Baseline: SEE OBJECTIVE DATA Goal status: INITIAL   4.  Assess TUG and set goals  Baseline: See objective data Goal status: Initial   5.  Patient will report no falls within the last 4 weeks of PT Baseline: See objective data Goal status: Initial   6.  Patient will ambulate > 300' with appropriate assistive device with modified independence Baseline: See objective data Goal status: Initial   PLAN:  PT FREQUENCY:  2x/week  PT DURATION: 12 weeks  PLANNED INTERVENTIONS: 97164- PT Re-evaluation, 97750- Physical Performance Testing, 97110-Therapeutic exercises, 97530- Therapeutic activity, [id number]- Neuromuscular re-education, 97535- Self Care, [id number]- Gait training, Patient/Family education, Balance training, Stair training, and DME instructions  PLAN FOR NEXT SESSION: Create HEP for patient addressing balance and hamstring/gastroc flexibility. Assess TUG/ set goals. Assess gait velocity.   Ismael Nap, Student-PT 11/09/2023, 4:52 PM   This entire session of physical therapy was performed under the direct supervision of PT signing  evaluation /treatment. PT reviewed note and agrees.   Grayce Spatz, PT, DPT 11/09/2023, 5:07 PM

## 2023-11-17 ENCOUNTER — Ambulatory Visit (INDEPENDENT_AMBULATORY_CARE_PROVIDER_SITE_OTHER): Admitting: Physician Assistant

## 2023-11-17 ENCOUNTER — Encounter: Payer: Self-pay | Admitting: Physician Assistant

## 2023-11-17 DIAGNOSIS — G8929 Other chronic pain: Secondary | ICD-10-CM

## 2023-11-17 DIAGNOSIS — M25561 Pain in right knee: Secondary | ICD-10-CM | POA: Diagnosis not present

## 2023-11-17 NOTE — Progress Notes (Signed)
 Office Visit Note   Patient: Gabriel Evans           Date of Birth: Sep 30, 1940           MRN: [id number] Visit Date: 11/17/2023              Requested by: Nichole Senior, MD [id number] [place],  [place] [address] PCP: Nichole Senior, MD  No chief complaint on file.     HPI: Gabriel Evans is a pleasant 83 year old gentleman who I have been following for right knee pain and right hamstring strain.  He actually is 90% better.  He recently began seeing Grayce in our PT department to work on his balance and gait.  He also has been into see his primary care doctor to discuss this.  Primary care doctor has referred him to a cardiologist for evaluation.  Patient did have a recent fall but was low level.  He placed weight on his right leg and then fell onto the onto the left side.  This happened about a week ago no shortness of breath no dizziness.  Did not have any ecchymosis.   Assessment & Plan: Visit Diagnoses:  1. Chronic pain of right knee     Plan: Patient does have some mild tenderness over the lower rib cage.  He has no difficulty breathing no shortness of breath he is comfortable.  I discussed with him this was either a contusion or low suspicion fracture of the rib no treatment needed other than symptomatic will follow-up with me as  Follow-Up Instructions: Return if symptoms worsen or fail to improve.   Ortho Exam  Patient is alert, oriented, no adenopathy, well-dressed, normal affect, normal respiratory effort. Examination knee has good range of motion no tenderness over the hamstring he does have some crepitus compartments are soft and compressible.  He is have some tenderness over the lower posterior left rib cage.  No no ecchymosis no crepitus palpated he has good excursion with deep breathing    Imaging: No results found. No images are attached to the encounter.  Labs: No results found for: HGBA1C, ESRSEDRATE, CRP, LABURIC, REPTSTATUS, GRAMSTAIN, CULT,  LABORGA   Lab Results  Component Value Date   ALBUMIN 4.0 03/30/2009    No results found for: MG No results found for: VD25OH  No results found for: PREALBUMIN    Latest Ref Rng & Units 04/06/2009    5:10 AM 04/05/2009    5:08 AM 04/04/2009    5:17 AM  CBC EXTENDED  WBC 4.0 - 10.5 K/uL 6.6  8.3  10.1   RBC 4.22 - 5.81 MIL/uL 2.69  2.84  3.76   Hemoglobin 13.0 - 17.0 g/dL 8.9  9.3 DELTA CHECK NOTED  12.4   HCT 39.0 - 52.0 % 25.3  26.6  35.4   Platelets 150 - 400 K/uL 160  166 DELTA CHECK NOTED  225      There is no height or weight on file to calculate BMI.  Orders:  No orders of the defined types were placed in this encounter.  No orders of the defined types were placed in this encounter.    Procedures: No procedures performed  Clinical Data: No additional findings.  ROS:  All other systems negative, except as noted in the HPI. Review of Systems  Objective: Vital Signs: There were no vitals taken for this visit.  Specialty Comments:  No specialty comments available.  PMFS History: Patient Active Problem List   Diagnosis Date Noted  Pain in right elbow 05/29/2022   Pain in right knee 05/29/2022   Occlusion and stenosis of bilateral carotid arteries 06/26/2021   Diabetes (HCC) 03/19/2021   Skin lesion 03/19/2021   Callus 02/28/2020   Chronic kidney disease, stage 3a (HCC) 04/07/2019   Insomnia 04/07/2019   Benign prostatic hyperplasia with lower urinary tract symptoms 12/03/2017   Chronic obstructive pulmonary disease (HCC) 04/29/2017   Abnormal gait 02/03/2017   Anxiety disorder 02/03/2017   Benign neoplasm of colon 02/03/2017   Hyperlipidemia 02/03/2017   Polyneuropathy due to type 2 diabetes mellitus (HCC) 02/03/2017   Postoperative hypothyroidism 02/03/2017   Past Medical History:  Diagnosis Date   Allergy    Cataract    Diabetes mellitus without complication (HCC)    Thyroid  disease     History reviewed. No pertinent family  history.  Past Surgical History:  Procedure Laterality Date   EYE SURGERY     FRACTURE SURGERY     JOINT REPLACEMENT     VASECTOMY     Social History   Occupational History   Not on file  Tobacco Use   Smoking status: Every Day    Types: Pipe, Cigars   Smokeless tobacco: Never  Vaping Use   Vaping status: Never Used  Substance and Sexual Activity   Alcohol use: Never   Drug use: Never   Sexual activity: Not on file

## 2023-11-23 ENCOUNTER — Ambulatory Visit (INDEPENDENT_AMBULATORY_CARE_PROVIDER_SITE_OTHER): Admitting: Physical Therapy

## 2023-11-23 ENCOUNTER — Encounter: Payer: Self-pay | Admitting: Physical Therapy

## 2023-11-23 DIAGNOSIS — R2681 Unsteadiness on feet: Secondary | ICD-10-CM | POA: Diagnosis not present

## 2023-11-23 DIAGNOSIS — R296 Repeated falls: Secondary | ICD-10-CM | POA: Diagnosis not present

## 2023-11-23 DIAGNOSIS — R2689 Other abnormalities of gait and mobility: Secondary | ICD-10-CM

## 2023-11-23 DIAGNOSIS — M6281 Muscle weakness (generalized): Secondary | ICD-10-CM

## 2023-11-23 DIAGNOSIS — R29898 Other symptoms and signs involving the musculoskeletal system: Secondary | ICD-10-CM

## 2023-11-23 DIAGNOSIS — G8929 Other chronic pain: Secondary | ICD-10-CM

## 2023-11-23 DIAGNOSIS — M25561 Pain in right knee: Secondary | ICD-10-CM

## 2023-11-23 NOTE — Therapy (Addendum)
 OUTPATIENT PHYSICAL THERAPY LOWER EXTREMITY EVALUATION   Patient Name: Gabriel Evans MRN: [id number] DOB:Oct 30, 1940, 83 y.o., male Today's Date: 11/23/2023  END OF SESSION:  PT End of Session - 11/23/23 1147     Visit Number 2    Number of Visits 25    Date for PT Re-Evaluation 02/04/24    Authorization Type MEDICARE AND [employer]    Progress Note Due on Visit 10    PT Start Time 1147    PT Stop Time 1228    PT Time Calculation (min) 41 min    Activity Tolerance Patient tolerated treatment well    Behavior During Therapy [facility] for tasks assessed/performed           Past Medical History:  Diagnosis Date   Allergy    Cataract    Diabetes mellitus without complication (HCC)    Thyroid  disease    Past Surgical History:  Procedure Laterality Date   EYE SURGERY     FRACTURE SURGERY     JOINT REPLACEMENT     VASECTOMY     Patient Active Problem List   Diagnosis Date Noted   Pain in right elbow 05/29/2022   Pain in right knee 05/29/2022   Occlusion and stenosis of bilateral carotid arteries 06/26/2021   Diabetes (HCC) 03/19/2021   Skin lesion 03/19/2021   Callus 02/28/2020   Chronic kidney disease, stage 3a (HCC) 04/07/2019   Insomnia 04/07/2019   Benign prostatic hyperplasia with lower urinary tract symptoms 12/03/2017   Chronic obstructive pulmonary disease (HCC) 04/29/2017   Abnormal gait 02/03/2017   Anxiety disorder 02/03/2017   Benign neoplasm of colon 02/03/2017   Hyperlipidemia 02/03/2017   Polyneuropathy due to type 2 diabetes mellitus (HCC) 02/03/2017   Postoperative hypothyroidism 02/03/2017    PCP: Nichole Senior, MD  REFERRING PROVIDER: Persons, Ronal Dragon, PA   REFERRING DIAG: [phone] (ICD-10-CM) - Chronic pain of right knee   THERAPY DIAG:  Unsteadiness on feet  Repeated falls  Muscle weakness (generalized)  Other abnormalities of gait and mobility  Chronic pain of right knee  Other symptoms and signs involving the musculoskeletal  system  Rationale for Evaluation and Treatment: Rehabilitation  ONSET DATE: 10/27/23 PA referral to PT  SUBJECTIVE:   SUBJECTIVE STATEMENT: Patient is overall doing well. Patient reports one fall since previous session.  He went to turn off the lamp and fell which caused him to bruise his ribs. He reports some pain but sitting up helps diminish that pain.   From Eval: Patient on 10/27/2023, reported to PA with chronic R knee and hamstring pain. Patient has history of falls and R sided weakness. He had a neurologic work up that indicated polyneuropathy. Patient reports to PT today after an initial fall estimated 8 weeks ago in which he landed on his R knee. He believes it occurred due to broken toe when stepping out of room. 4 weeks after the initial fall, he fell again on his R knee after his toe got caught when standing up from chair. His most recent fall was last night when he turned off the lights, turned around, fell and scrapped his elbow. When he stands up, he hears noise from his leg and feels unstable initially until he stands and walks. Patient notices his R knee doesn't hold his weight and reports symptoms of buckling.   PERTINENT HISTORY: Occlusion and stenosis of bilateral carotid arteries, diabetes, polyneuropathy, chronic kidney disease stage 3a, Benign prostatic hyperplasia with lower urinary tract symptoms, COPD, Benign  neoplasm of colon, Cataract, Joint replacement  PAIN:  Are you having pain? Yes: NPRS scale: 5/10 when initially standing Pain location: hamstring and calf Pain description: tightness and racheting Aggravating factors: initially standing up  Relieving factors: tylenol and taking a few steps   PRECAUTIONS: Fall  RED FLAGS: None   WEIGHT BEARING RESTRICTIONS: No  FALLS:  Has patient fallen in last 6 months? Yes. Number of falls 2-3x a month, injuries include broken toe, scrap on elbow, black eye  LIVING ENVIRONMENT: Lives with: lives with their spouse who  has disabilities  Lives in: [place], 2 story with master bedroom and bathroom downstairs  Stairs: Yes: Internal: 8+8 steps; on right going up and External: 4-5 steps; on right going up Has following equipment at home: Single point cane, Environmental consultant - 2 wheeled, Tour manager, and Grab bars  OCCUPATION: retired   PLOF: Independent  He reports repetitive falls   PATIENT GOALS:  Get rid of R knee pain. Improve strength and balance to decrease fall frequency   NEXT MD VISIT:  11/17/2023   OBJECTIVE:  DIAGNOSTIC FINDINGS:  10/27/2023, Radiographs of his R knee show degenerative changes with no acute fractures noted   Patient-Specific Activity Scoring Scheme  0 represents "unable to perform." 10 represents "able to perform at prior level. 0 1 2 3 4 5 6 7 8 9  10 (Date and Score)   Activity Eval     1. ADLs 5    2. Walking 5    3. Prevent falls 5   4.    5.    Score 5    Total score = sum of the activity scores/number of activities Minimum detectable change (90%CI) for average score = 2 points Minimum detectable change (90%CI) for single activity score = 3 points  COGNITION: Overall cognitive status: WFL    SENSATION: Patient states no sensation in his feet due to diabetes  MUSCLE LENGTH Hamstring (hip position at 90* and knee extending) R: - 38* L: -48*  POSTURE: rounded shoulders and forward head  LOWER EXTREMITY ROM:   ROM Right eval Left eval  Hip flexion    Hip extension    Hip abduction    Hip adduction    Hip internal rotation    Hip external rotation    Knee flexion    Knee extension    Ankle dorsiflexion P: - 6 P: - 8  Ankle plantarflexion    Ankle inversion    Ankle eversion     (Blank rows = not tested)  LOWER EXTREMITY MMT:  MMT Right eval Left eval  Hip flexion    Hip extension    Hip abduction    Hip adduction    Hip internal rotation    Hip external rotation    Knee flexion    Knee extension 5/5 5/5  Ankle dorsiflexion    Ankle  plantarflexion    Ankle inversion    Ankle eversion     (Blank rows = not tested)   FUNCTIONAL TESTS:  11/23/2023 TUG: 13.25 sec with CGA Cognitive TUG:14.37 sec with CGA stopped naming during turns Gait Speed (self-selected):  1.29 ft/sec Gait Speed (fast): 1.39 ft/sec  11/09/2023 Lars Balance 21/56  GAIT Gait pattern: excessive arm swing for counterbalance BUEs, step through pattern, decreased step length- Right, decreased step length- Left, decreased stride length, decreased hip/knee flexion- Right, decreased hip/knee flexion- Left, knee flexed in stance- Right, lateral lean- Left, trunk flexed, and wide BOS Distance walked: 100' x 2  Assistive device utilized: None Level of assistance: SBA / verbal cues needed for gait deviations  TODAY'S TREATMENT                                                                          DATE:  11/23/2023 Physical Performance TUG, Cognitive TUG, Gait speed  TherEx Gastroc stretch on step 3x30 seconds each leg Seated hamstring stretch 3x30 seconds each leg   Neuro Re-ed  (standing balance) Feet shoulder-width apart:  Head turns - diagonal, up/down, side to side 5x each with eyes open standing hip width on floor Static eyes closed standing hip width floor 10 seconds 3 reps Head turns - diagonal, up/down, side to side 5x each with eyes closed standing hip width on floor with single fingertip support on side of chair.  Head turns - diagonal, up/down, side to side 5x each with eyes open standing hip width on foam with fingertip support bilateral index fingers Minimal sway with eyes open and moderate sway with eyes closed.  Expressed difficulty with looking up with eyes open or closed. No UE support with eyes open, finger touch support with eyes closed.  PT instructed in gastroc & hamstring stretches and standing balance exercises in corner for safety. HO, verbal and demo cues.  Patient verbalized understanding after completing in PT session.    PATIENT EDUCATION:  Education details: POC Person educated: Patient Education method: Explanation, Verbal cues, handout Education comprehension: verbalized understanding  HOME EXERCISE PROGRAM: Access Code: [email or web address] URL: [email or web address] Date: 11/23/2023 Prepared by: Grayce Spatz   Exercises - Gastroc Stretch on Step  - 1-2 x daily - 7 x weekly - 1 sets - 3 reps - 30 seconds hold - Seated Hamstring Stretch with Strap  - 1-2 x daily - 7 x weekly - 1 sets - 3 reps - 30 seconds hold - wide stance head motions eyes open  - 1 x daily - 4 x weekly - 1 sets - 5-10 reps - 2 seconds hold - Standing Balance with Eyes Closed  - 1 x daily - 4 x weekly - 1 sets - 3 reps - 10 seconds hold - Feet Apart with Eyes Closed with Head Motions  - 1 x daily - 4 x weekly - 1 sets - 5-10 reps - 2 seconds hold - Wide stance on Foam Pad head movements  - 1 x daily - 4 x weekly - 1 sets - 5-10 reps - 2 seconds hold  ASSESSMENT: CLINICAL IMPRESSION:  Patient's gait speed is below normal levels for community dwelling older adult which places him at a higher risk for falls. Patient's TUG score indicates high risk of falls in the community.  Additionally patient uses UE with turning during the TUG. Patient responded well to balance exercises and will benefit from challenges with scanning and eyes closed.  Patient will continue to benefit from skilled physical therapy to address deficits and minimize falls.  Patient appears to understand initial HEP instructed today.  OBJECTIVE IMPAIRMENTS: Abnormal gait, cardiopulmonary status limiting activity, decreased balance, decreased endurance, decreased knowledge of use of DME, decreased mobility, decreased ROM, decreased strength, impaired flexibility, impaired sensation, postural dysfunction, and pain.   ACTIVITY LIMITATIONS: carrying, lifting, standing, stairs, transfers, and locomotion  level  PARTICIPATION LIMITATIONS: meal prep, cleaning, laundry,  community activity, and yard work  PERSONAL FACTORS: Age, Fitness, Past/current experiences, Time since onset of injury/illness/exacerbation, and 3+ comorbidities: see PMH are also affecting patient's functional outcome.   REHAB POTENTIAL: Good  CLINICAL DECISION MAKING: Evolving/moderate complexity  EVALUATION COMPLEXITY: Moderate   GOALS: Goals reviewed with patient? Yes  SHORT TERM GOALS: target date for Short term goals 12/07/23   1.  Patient will demonstrate independent use of home exercise program to maintain progress from in clinic treatments.  Baseline: See objective data Goal status: ongoing, 11/23/2023  2. Patient will demonstrate TUG </= 12 seconds without use of UE for turning.  Baseline: See objective data Goal status: ongoing, 11/23/2023  3. BERG score 25/56 Baseline: See objective data Goal status: ongoing, 11/23/2023  LONG TERM GOALS: target dates for all long term goals 02/04/24   1. Patient will demonstrate/report pain at worst less than or equal to 2/10 to facilitate minimal limitation in daily activity secondary to pain symptoms. Baseline: See objective data Goal status: ongoing, 11/23/2023   2. Patient will demonstrate independent use of home exercise program to facilitate ability to maintain/progress functional gains from skilled physical therapy services. Baseline: See objective data Goal status: ongoing, 11/23/2023  3.  BERG score >/= 30/56 for MDIC  Baseline: SEE OBJECTIVE DATA Goal status: ongoing, 11/23/2023   4. Patient will demonstrate TUG </= 10 seconds without use of UE for turning.  Baseline: See objective data Goal status: ongoing, 11/23/2023   5.  Patient will report no falls within the last 4 weeks of PT Baseline: See objective data Goal status:ongoing, 11/23/2023   6.  Patient will ambulate > 300' with appropriate assistive device with modified independence Baseline: See objective data Goal status: ongoing, 11/23/2023   PLAN:  PT  FREQUENCY:  2x/week  PT DURATION: 12 weeks  PLANNED INTERVENTIONS: 97164- PT Re-evaluation, 97750- Physical Performance Testing, 97110-Therapeutic exercises, 97530- Therapeutic activity, [id number]- Neuromuscular re-education, 97535- Self Care, [id number]- Gait training, Patient/Family education, Balance training, Stair training, and DME instructions  PLAN FOR NEXT SESSION: check in with HEP. Work on turning safely.  Work on functional strength and balance activities.  Ismael Theophilus Stallion, SPT, DTaP 11/23/2023, 17:03 pm  This entire session of physical therapy was performed under the direct supervision of PT signing evaluation /treatment. PT reviewed note and agrees.  Grayce Spatz, PT, DPT 11/23/2023, 5:16 PM

## 2023-11-26 ENCOUNTER — Ambulatory Visit (INDEPENDENT_AMBULATORY_CARE_PROVIDER_SITE_OTHER): Admitting: Physical Therapy

## 2023-11-26 ENCOUNTER — Encounter: Payer: Self-pay | Admitting: Physical Therapy

## 2023-11-26 DIAGNOSIS — R2681 Unsteadiness on feet: Secondary | ICD-10-CM | POA: Diagnosis not present

## 2023-11-26 DIAGNOSIS — R29898 Other symptoms and signs involving the musculoskeletal system: Secondary | ICD-10-CM

## 2023-11-26 DIAGNOSIS — R2689 Other abnormalities of gait and mobility: Secondary | ICD-10-CM

## 2023-11-26 DIAGNOSIS — M6281 Muscle weakness (generalized): Secondary | ICD-10-CM

## 2023-11-26 DIAGNOSIS — R296 Repeated falls: Secondary | ICD-10-CM

## 2023-11-26 DIAGNOSIS — M25561 Pain in right knee: Secondary | ICD-10-CM

## 2023-11-26 DIAGNOSIS — G8929 Other chronic pain: Secondary | ICD-10-CM

## 2023-11-26 NOTE — Therapy (Addendum)
 OUTPATIENT PHYSICAL THERAPY LOWER EXTREMITY TREATMENT   Patient Name: Gabriel Evans MRN: [id number] DOB:[birth year], 83 y.o., male Today's Date: 11/26/2023  END OF SESSION:  PT End of Session - 11/26/23 1144     Visit Number 3    Number of Visits 25    Date for PT Re-Evaluation 02/04/24    Authorization Type MEDICARE AND [employer]    Progress Note Due on Visit 10    PT Start Time 1145    PT Stop Time 1230    PT Time Calculation (min) 45 min    Equipment Utilized During Treatment Gait belt    Activity Tolerance Patient tolerated treatment well    Behavior During Therapy WFL for tasks assessed/performed            Past Medical History:  Diagnosis Date   Allergy    Cataract    Diabetes mellitus without complication (HCC)    Thyroid  disease    Past Surgical History:  Procedure Laterality Date   EYE SURGERY     FRACTURE SURGERY     JOINT REPLACEMENT     VASECTOMY     Patient Active Problem List   Diagnosis Date Noted   Pain in right elbow 05/29/2022   Pain in right knee 05/29/2022   Occlusion and stenosis of bilateral carotid arteries 06/26/2021   Diabetes (HCC) 03/19/2021   Skin lesion 03/19/2021   Callus 02/28/2020   Chronic kidney disease, stage 3a (HCC) 04/07/2019   Insomnia 04/07/2019   Benign prostatic hyperplasia with lower urinary tract symptoms 12/03/2017   Chronic obstructive pulmonary disease (HCC) 04/29/2017   Abnormal gait 02/03/2017   Anxiety disorder 02/03/2017   Benign neoplasm of colon 02/03/2017   Hyperlipidemia 02/03/2017   Polyneuropathy due to type 2 diabetes mellitus (HCC) 02/03/2017   Postoperative hypothyroidism 02/03/2017    PCP: Nichole Senior, MD  REFERRING PROVIDER: Persons, Ronal Dragon, PA   REFERRING DIAG: [phone] (ICD-10-CM) - Chronic pain of right knee   THERAPY DIAG:  Unsteadiness on feet  Repeated falls  Muscle weakness (generalized)  Other abnormalities of gait and mobility  Chronic pain of right knee  Other  symptoms and signs involving the musculoskeletal system  Rationale for Evaluation and Treatment: Rehabilitation  ONSET DATE: 10/27/23 PA referral to PT  SUBJECTIVE:   SUBJECTIVE STATEMENT: Patient is overall doing well.  He reports no new falls or trips.  Patient reports being consistent with his HEP.  He also says he does better in the mornings and by nighttime he is fatigued and more likely to fall.   From Eval: Patient on 10/27/2023, reported to PA with chronic R knee and hamstring pain. Patient has history of falls and R sided weakness. He had a neurologic work up that indicated polyneuropathy. Patient reports to PT today after an initial fall estimated 8 weeks ago in which he landed on his R knee. He believes it occurred due to broken toe when stepping out of room. 4 weeks after the initial fall, he fell again on his R knee after his toe got caught when standing up from chair. His most recent fall was last night when he turned off the lights, turned around, fell and scrapped his elbow. When he stands up, he hears noise from his leg and feels unstable initially until he stands and walks. Patient notices his R knee doesn't hold his weight and reports symptoms of buckling.   PERTINENT HISTORY: Occlusion and stenosis of bilateral carotid arteries, diabetes, polyneuropathy, chronic kidney disease  stage 3a, Benign prostatic hyperplasia with lower urinary tract symptoms, COPD, Benign neoplasm of colon, Cataract, Joint replacement  PAIN:  Are you having pain? Yes: NPRS scale: 2/10 when standing Pain location: hamstring and calf Pain description: tightness and racheting Aggravating factors: initially standing up  Relieving factors: tylenol and taking a few steps   PRECAUTIONS: Fall  RED FLAGS: None   WEIGHT BEARING RESTRICTIONS: No  FALLS:  Has patient fallen in last 6 months? Yes. Number of falls 2-3x a month, injuries include broken toe, scrap on elbow, black eye  LIVING  ENVIRONMENT: Lives with: lives with their spouse who has disabilities  Lives in[place], 2 story with master bedroom and bathroom downstairs  Stairs: Yes: Internal: 8+8 steps; on right going up and External: 4-5 steps; on right going up Has following equipment at home: Single point cane, Environmental consultant - 2 wheeled, Tour manager, and Grab bars  OCCUPATION: retired   PLOF: Independent  He reports repetitive falls   PATIENT GOALS:  Get rid of R knee pain. Improve strength and balance to decrease fall frequency   NEXT MD VISIT:  tbd   OBJECTIVE:  DIAGNOSTIC FINDINGS:  10/27/2023, Radiographs of his R knee show degenerative changes with no acute fractures noted   Patient-Specific Activity Scoring Scheme  0 represents "unable to perform." 10 represents "able to perform at prior level. 0 1 2 3 4 5 6 7 8 9  10 (Date and Score)   Activity Eval     1. ADLs 5    2. Walking 5    3. Prevent falls 5   4.    5.    Score 5    Total score = sum of the activity scores/number of activities Minimum detectable change (90%CI) for average score = 2 points Minimum detectable change (90%CI) for single activity score = 3 points  COGNITION: Overall cognitive status: WFL    SENSATION: Patient states no sensation in his feet due to diabetes  MUSCLE LENGTH Hamstring (hip position at 90* and knee extending) R: - 38* L: -48*  POSTURE: rounded shoulders and forward head  LOWER EXTREMITY ROM:   ROM Right eval Left eval  Hip flexion    Hip extension    Hip abduction    Hip adduction    Hip internal rotation    Hip external rotation    Knee flexion    Knee extension    Ankle dorsiflexion P: - 6 P: - 8  Ankle plantarflexion    Ankle inversion    Ankle eversion     (Blank rows = not tested)  LOWER EXTREMITY MMT:  MMT Right eval Left eval  Hip flexion    Hip extension    Hip abduction    Hip adduction    Hip internal rotation    Hip external rotation    Knee flexion    Knee extension  5/5 5/5  Ankle dorsiflexion    Ankle plantarflexion    Ankle inversion    Ankle eversion     (Blank rows = not tested)   FUNCTIONAL TESTS:  11/23/2023 TUG: 13.25 sec with CGA Cognitive TUG:14.37 sec with CGA stopped naming during turns Gait Speed (self-selected):  1.29 ft/sec Gait Speed (fast): 1.39 ft/sec  11/09/2023 Lars Balance 21/56  GAIT Gait pattern: excessive arm swing for counterbalance BUEs, step through pattern, decreased step length- Right, decreased step length- Left, decreased stride length, decreased hip/knee flexion- Right, decreased hip/knee flexion- Left, knee flexed in stance- Right, lateral lean-  Left, trunk flexed, and wide BOS Distance walked: 100' x 2 Assistive device utilized: None Level of assistance: SBA / verbal cues needed for gait deviations  TODAY'S TREATMENT                                                                          DATE:  11/26/2023  TherEx: Gastroc stretch on step, 3x30 seconds each leg Standing hamstring stretch on step in // bars, 3x30 seconds each leg  Neuro Re-ed Feet shoulder-width apart standing cross way on foam beam focused on hip strategy Static standing minimal to no UE support  Head turns - diagonal, up/down, side to side 5x each with eyes open and no UE support w/ CGA Focused on keeping balance by refocusing in mirror in front. When patient looked down, he swayed the most forward.  Static eyes closed standing hip width floor 10 seconds 5 reps w/ no UE and w/ CGA. Tendency to sway forward and to the Rt Feet shoulder-width apart standing cross way on foam beam focused on anticipatory postural control facilitating step strategy Stance on foam beam then Step forward and backwards with no UE support and w/ CGA working to stabilize, he required intermittent touch on //bars for balance/stabilization 10x each Stance on foam beam then lateral Step Lt and Rt with no UE support and w/ CGA, 10x each. Cued to reach out to the side and step  once they feel like they're falling. Cued to not cross over w/ R LE when stepping to Lt side. Had most difficulty with stepping to the side. PT verbally educated pt on 3 balance systems including their functions and how environment challenges if 1 is impaired.  And instruction in 3 strategies used to maintain balance.  Pt verbalized understanding.    PT instructed pt to continue with HEP established last session but not to try challenging activities worked on during today's session. Pt verbalized understanding.    TREATMENT                                                                          DATE:  11/23/2023 Physical Performance TUG, Cognitive TUG, Gait speed  TherEx Gastroc stretch on step 3x30 seconds each leg Seated hamstring stretch 3x30 seconds each leg   Neuro Re-ed  (standing balance) Feet shoulder-width apart:  Head turns - diagonal, up/down, side to side 5x each with eyes open standing hip width on floor Static eyes closed standing hip width floor 10 seconds 3 reps Head turns - diagonal, up/down, side to side 5x each with eyes closed standing hip width on floor with single fingertip support on side of chair.  Head turns - diagonal, up/down, side to side 5x each with eyes open standing hip width on foam with fingertip support bilateral index fingers Minimal sway with eyes open and moderate sway with eyes closed.  Expressed difficulty with looking up with eyes open or closed. No UE support with eyes open,  finger touch support with eyes closed.  PT instructed in gastroc & hamstring stretches and standing balance exercises in corner for safety. HO, verbal and demo cues.  Patient verbalized understanding after completing in PT session.   PATIENT EDUCATION:  Education details: POC Person educated: Patient Education method: Explanation, Verbal cues, handout Education comprehension: verbalized understanding  HOME EXERCISE PROGRAM: Access Code: [email or web address] URL:  [email or web address] Date: 11/23/2023 Prepared by: Grayce Spatz   Exercises - Gastroc Stretch on Step  - 1-2 x daily - 7 x weekly - 1 sets - 3 reps - 30 seconds hold - Seated Hamstring Stretch with Strap  - 1-2 x daily - 7 x weekly - 1 sets - 3 reps - 30 seconds hold - wide stance head motions eyes open  - 1 x daily - 4 x weekly - 1 sets - 5-10 reps - 2 seconds hold - Standing Balance with Eyes Closed  - 1 x daily - 4 x weekly - 1 sets - 3 reps - 10 seconds hold - Feet Apart with Eyes Closed with Head Motions  - 1 x daily - 4 x weekly - 1 sets - 5-10 reps - 2 seconds hold - Wide stance on Foam Pad head movements  - 1 x daily - 4 x weekly - 1 sets - 5-10 reps - 2 seconds hold  ASSESSMENT: CLINICAL IMPRESSION:  Patient responded well to balance exercises introduced today. He has most trouble keeping his balance when his COM comes forward and to the side. Additionally he is quick to move when he feels unstable which puts him at more risk of falls. This can be corrected with reactive balance exercises and introducing eccentric control strengthening.  Patient will continue to benefit from skilled physical therapy to address deficits and minimize falls.   OBJECTIVE IMPAIRMENTS: Abnormal gait, cardiopulmonary status limiting activity, decreased balance, decreased endurance, decreased knowledge of use of DME, decreased mobility, decreased ROM, decreased strength, impaired flexibility, impaired sensation, postural dysfunction, and pain.   ACTIVITY LIMITATIONS: carrying, lifting, standing, stairs, transfers, and locomotion level  PARTICIPATION LIMITATIONS: meal prep, cleaning, laundry, community activity, and yard work  PERSONAL FACTORS: Age, Fitness, Past/current experiences, Time since onset of injury/illness/exacerbation, and 3+ comorbidities: see PMH are also affecting patient's functional outcome.   REHAB POTENTIAL: Good  CLINICAL DECISION MAKING: Evolving/moderate  complexity  EVALUATION COMPLEXITY: Moderate   GOALS: Goals reviewed with patient? Yes  SHORT TERM GOALS: target date for Short term goals 12/07/23   1.  Patient will demonstrate independent use of home exercise program to maintain progress from in clinic treatments.  Baseline: See objective data Goal status: ongoing, 11/23/2023  2. Patient will demonstrate TUG </= 12 seconds without use of UE for turning.  Baseline: See objective data Goal status: ongoing, 11/23/2023  3. BERG score 25/56 Baseline: See objective data Goal status: ongoing, 11/23/2023  LONG TERM GOALS: target dates for all long term goals 02/04/24   1. Patient will demonstrate/report pain at worst less than or equal to 2/10 to facilitate minimal limitation in daily activity secondary to pain symptoms. Baseline: See objective data Goal status: ongoing, 11/23/2023   2. Patient will demonstrate independent use of home exercise program to facilitate ability to maintain/progress functional gains from skilled physical therapy services. Baseline: See objective data Goal status: ongoing, 11/23/2023  3.  BERG score >/= 30/56 for MDIC  Baseline: SEE OBJECTIVE DATA Goal status: ongoing, 11/23/2023   4. Patient will demonstrate TUG </= 10 seconds without use of UE for  turning.  Baseline: See objective data Goal status: ongoing, 11/23/2023   5.  Patient will report no falls within the last 4 weeks of PT Baseline: See objective data Goal status:ongoing, 11/23/2023   6.  Patient will ambulate > 300' with appropriate assistive device with modified independence Baseline: See objective data Goal status: ongoing, 11/23/2023   PLAN:  PT FREQUENCY:  2x/week  PT DURATION: 12 weeks  PLANNED INTERVENTIONS: 97164- PT Re-evaluation, 97750- Physical Performance Testing, 97110-Therapeutic exercises, 97530- Therapeutic activity, [id number]- Neuromuscular re-education, 97535- Self Care, [id number]- Gait training, Patient/Family education, Balance  training, Stair training, and DME instructions  PLAN FOR NEXT SESSION:  Introduce reactive step strategy balance exercises. Work with balance when turning and scanning   Ismael Nap, Student-PT, DPT 11/26/2023, 3:34 PM  This entire session of physical therapy was performed under the direct supervision of PT signing evaluation /treatment. PT reviewed note and agrees.   Grayce Spatz, PT, DPT 11/26/2023, 5:10 PM

## 2023-12-01 ENCOUNTER — Ambulatory Visit (INDEPENDENT_AMBULATORY_CARE_PROVIDER_SITE_OTHER): Admitting: Physical Therapy

## 2023-12-01 ENCOUNTER — Encounter: Payer: Self-pay | Admitting: Physical Therapy

## 2023-12-01 DIAGNOSIS — R296 Repeated falls: Secondary | ICD-10-CM

## 2023-12-01 DIAGNOSIS — R2689 Other abnormalities of gait and mobility: Secondary | ICD-10-CM | POA: Diagnosis not present

## 2023-12-01 DIAGNOSIS — M6281 Muscle weakness (generalized): Secondary | ICD-10-CM | POA: Diagnosis not present

## 2023-12-01 DIAGNOSIS — M25561 Pain in right knee: Secondary | ICD-10-CM

## 2023-12-01 DIAGNOSIS — R29898 Other symptoms and signs involving the musculoskeletal system: Secondary | ICD-10-CM

## 2023-12-01 DIAGNOSIS — R2681 Unsteadiness on feet: Secondary | ICD-10-CM

## 2023-12-01 DIAGNOSIS — G8929 Other chronic pain: Secondary | ICD-10-CM

## 2023-12-01 NOTE — Therapy (Addendum)
 OUTPATIENT PHYSICAL THERAPY LOWER EXTREMITY TREATMENT   Patient Name: Gabriel Evans MRN: [id number] DOB:[birth year], 83 y.o., male Today's Date: 12/01/2023  END OF SESSION:  PT End of Session - 12/01/23 1141     Visit Number 4    Number of Visits 25    Date for PT Re-Evaluation 02/04/24    Authorization Type MEDICARE AND [employer]    Progress Note Due on Visit 10    PT Start Time 1145    PT Stop Time 1232    PT Time Calculation (min) 47 min    Equipment Utilized During Treatment Gait belt    Activity Tolerance Patient tolerated treatment well    Behavior During Therapy WFL for tasks assessed/performed             Past Medical History:  Diagnosis Date   Allergy    Cataract    Diabetes mellitus without complication (HCC)    Thyroid  disease    Past Surgical History:  Procedure Laterality Date   EYE SURGERY     FRACTURE SURGERY     JOINT REPLACEMENT     VASECTOMY     Patient Active Problem List   Diagnosis Date Noted   Pain in right elbow 05/29/2022   Pain in right knee 05/29/2022   Occlusion and stenosis of bilateral carotid arteries 06/26/2021   Diabetes (HCC) 03/19/2021   Skin lesion 03/19/2021   Callus 02/28/2020   Chronic kidney disease, stage 3a (HCC) 04/07/2019   Insomnia 04/07/2019   Benign prostatic hyperplasia with lower urinary tract symptoms 12/03/2017   Chronic obstructive pulmonary disease (HCC) 04/29/2017   Abnormal gait 02/03/2017   Anxiety disorder 02/03/2017   Benign neoplasm of colon 02/03/2017   Hyperlipidemia 02/03/2017   Polyneuropathy due to type 2 diabetes mellitus (HCC) 02/03/2017   Postoperative hypothyroidism 02/03/2017    PCP: Nichole Senior, MD  REFERRING PROVIDER: Persons, Ronal Dragon, PA   REFERRING DIAG: [phone] (ICD-10-CM) - Chronic pain of right knee   THERAPY DIAG:  Unsteadiness on feet  Repeated falls  Muscle weakness (generalized)  Other abnormalities of gait and mobility  Other symptoms and signs  involving the musculoskeletal system  Chronic pain of right knee  Rationale for Evaluation and Treatment: Rehabilitation  ONSET DATE: 10/27/23 PA referral to PT  SUBJECTIVE:   SUBJECTIVE STATEMENT: Patient reports no falls.  He reports some soreness after previous PT session.  Patient used a stepping stool over the week and had difficulty using the second step.  From Eval: Patient on 10/27/2023, reported to PA with chronic R knee and hamstring pain. Patient has history of falls and R sided weakness. He had a neurologic work up that indicated polyneuropathy. Patient reports to PT today after an initial fall estimated 8 weeks ago in which he landed on his R knee. He believes it occurred due to broken toe when stepping out of room. 4 weeks after the initial fall, he fell again on his R knee after his toe got caught when standing up from chair. His most recent fall was last night when he turned off the lights, turned around, fell and scrapped his elbow. When he stands up, he hears noise from his leg and feels unstable initially until he stands and walks. Patient notices his R knee doesn't hold his weight and reports symptoms of buckling.   PERTINENT HISTORY: Occlusion and stenosis of bilateral carotid arteries, diabetes, polyneuropathy, chronic kidney disease stage 3a, Benign prostatic hyperplasia with lower urinary tract symptoms, COPD, Benign neoplasm  of colon, Cataract, Joint replacement  PAIN:  Are you having pain? Yes: NPRS scale: 2/10 when going from sitting to standing  Pain location: hamstring and calf Pain description: tightness and racheting Aggravating factors: initially standing up  Relieving factors: tylenol and taking a few steps   PRECAUTIONS: Fall  RED FLAGS: None   WEIGHT BEARING RESTRICTIONS: No  FALLS:  Has patient fallen in last 6 months? Yes. Number of falls 2-3x a month, injuries include broken toe, scrap on elbow, black eye  LIVING ENVIRONMENT: Lives with: lives  with their spouse who has disabilities  Lives in[place], 2 story with master bedroom and bathroom downstairs  Stairs: Yes: Internal: 8+8 steps; on right going up and External: 4-5 steps; on right going up Has following equipment at home: Single point cane, Environmental consultant - 2 wheeled, Tour manager, and Grab bars  OCCUPATION: retired   PLOF: Independent  He reports repetitive falls   PATIENT GOALS:  Get rid of R knee pain. Improve strength and balance to decrease fall frequency   NEXT MD VISIT:  tbd   OBJECTIVE:  DIAGNOSTIC FINDINGS:  10/27/2023, Radiographs of his R knee show degenerative changes with no acute fractures noted   Patient-Specific Activity Scoring Scheme  0 represents "unable to perform." 10 represents "able to perform at prior level. 0 1 2 3 4 5 6 7 8 9  10 (Date and Score)   Activity Eval     1. ADLs 5    2. Walking 5    3. Prevent falls 5   4.    5.    Score 5    Total score = sum of the activity scores/number of activities Minimum detectable change (90%CI) for average score = 2 points Minimum detectable change (90%CI) for single activity score = 3 points  COGNITION: Overall cognitive status: WFL    SENSATION: Patient states no sensation in his feet due to diabetes  MUSCLE LENGTH Hamstring (hip position at 90* and knee extending) R: - 38* L: -48*  POSTURE: rounded shoulders and forward head  LOWER EXTREMITY ROM:   ROM Right eval Left eval  Hip flexion    Hip extension    Hip abduction    Hip adduction    Hip internal rotation    Hip external rotation    Knee flexion    Knee extension    Ankle dorsiflexion P: - 6 P: - 8  Ankle plantarflexion    Ankle inversion    Ankle eversion     (Blank rows = not tested)  LOWER EXTREMITY MMT:  MMT Right eval Left eval  Hip flexion    Hip extension    Hip abduction    Hip adduction    Hip internal rotation    Hip external rotation    Knee flexion    Knee extension 5/5 5/5  Ankle dorsiflexion     Ankle plantarflexion    Ankle inversion    Ankle eversion     (Blank rows = not tested)   FUNCTIONAL TESTS:  11/23/2023 TUG: 13.25 sec with CGA Cognitive TUG:14.37 sec with CGA stopped naming during turns Gait Speed (self-selected):  1.29 ft/sec Gait Speed (fast): 1.39 ft/sec  11/09/2023 Lars Balance 21/56  GAIT Gait pattern: excessive arm swing for counterbalance BUEs, step through pattern, decreased step length- Right, decreased step length- Left, decreased stride length, decreased hip/knee flexion- Right, decreased hip/knee flexion- Left, knee flexed in stance- Right, lateral lean- Left, trunk flexed, and wide BOS Distance walked:  100' x 2 Assistive device utilized: None Level of assistance: SBA / verbal cues needed for gait deviations  TODAY'S TREATMENT                                                                          DATE:  12/01/2023  TherEx: Gastroc stretch on step, 2x30 seconds each leg Standing hamstring stretch on step in // bars, 2x30 seconds each leg  Neuro Re-Ed Semi-tandem stance balance exercises in // bars focused on ankle balance strategy Initially tried tandem stance but was unable to successfully let go of rails, so he transitioned to a semi-tandem stance R LE leading, L LE leading, eyes open on floor, eyes closed on foam. 30 seconds each position.  Patient demonstrated most stability when R LE leading with eyes open.  In just 30 seconds patient showed improvement in stability on floor with eyes closed. Exercise done to challenge ankle balance strategy Standing with 2 towels rolled together underneath patient's shoulder-width apart feet focused on hip balance strategy Head turns - diagonal, up/down, side to side 5x each with eyes open and no UE support w/ CGA Focused on keeping balance by refocusing in mirror in front. When patient looked down, he swayed the most forward.  Static eyes closed standing hip width floor. Attempting to stay balanced for 10  seconds for 5 reps w/ no UE and w/ CGA. Max time he got was 4 seconds. Tendency to sway forward and to the Rt Exercise done to challenge hip balance strategy Standing with 2 towels rolled together underneath patient's shoulder-width apart feet focused on anticipatory postural control facilitating step strategy Stand on towel, then reaching forward/backwards until having to Step forward/backwards with no UE support and w/ CGA working to stabilize, he required intermittent touch on //bars for balance/stabilization 5x each   TODAY'S TREATMENT                                                                          DATE:  11/26/2023  TherEx: Gastroc stretch on step, 3x30 seconds each leg Standing hamstring stretch on step in // bars, 3x30 seconds each leg  Neuro Re-ed Feet shoulder-width apart standing cross way on foam beam focused on hip strategy Static standing minimal to no UE support  Head turns - diagonal, up/down, side to side 5x each with eyes open and no UE support w/ CGA Focused on keeping balance by refocusing in mirror in front. When patient looked down, he swayed the most forward.  Static eyes closed standing hip width floor 10 seconds 5 reps w/ no UE and w/ CGA. Tendency to sway forward and to the Rt Feet shoulder-width apart standing cross way on foam beam focused on anticipatory postural control facilitating step strategy Stance on foam beam then Step forward and backwards with no UE support and w/ CGA working to stabilize, he required intermittent touch on //bars for balance/stabilization 10x each Stance on foam beam then lateral Step  Lt and Rt with no UE support and w/ CGA, 10x each. Cued to reach out to the side and step once they feel like they're falling. Cued to not cross over w/ R LE when stepping to Lt side. Had most difficulty with stepping to the side. PT verbally educated pt on 3 balance systems including their functions and how environment challenges if 1 is impaired.  And  instruction in 3 strategies used to maintain balance.  Pt verbalized understanding.    PT instructed pt to continue with HEP established last session but not to try challenging activities worked on during today's session. Pt verbalized understanding.    TREATMENT                                                                          DATE:  11/23/2023 Physical Performance TUG, Cognitive TUG, Gait speed  TherEx Gastroc stretch on step 3x30 seconds each leg Seated hamstring stretch 3x30 seconds each leg   Neuro Re-ed  (standing balance) Feet shoulder-width apart:  Head turns - diagonal, up/down, side to side 5x each with eyes open standing hip width on floor Static eyes closed standing hip width floor 10 seconds 3 reps Head turns - diagonal, up/down, side to side 5x each with eyes closed standing hip width on floor with single fingertip support on side of chair.  Head turns - diagonal, up/down, side to side 5x each with eyes open standing hip width on foam with fingertip support bilateral index fingers Minimal sway with eyes open and moderate sway with eyes closed.  Expressed difficulty with looking up with eyes open or closed. No UE support with eyes open, finger touch support with eyes closed.  PT instructed in gastroc & hamstring stretches and standing balance exercises in corner for safety. HO, verbal and demo cues.  Patient verbalized understanding after completing in PT session.   PATIENT EDUCATION:  Education details: POC Person educated: Patient Education method: Explanation, Verbal cues, handout Education comprehension: verbalized understanding  HOME EXERCISE PROGRAM: Access Code: [email or web address] URL: [email or web address] Date: 11/23/2023 Prepared by: Grayce Spatz   Exercises - Gastroc Stretch on Step  - 1-2 x daily - 7 x weekly - 1 sets - 3 reps - 30 seconds hold - Seated Hamstring Stretch with Strap  - 1-2 x daily - 7 x weekly - 1 sets - 3 reps - 30 seconds  hold - wide stance head motions eyes open  - 1 x daily - 4 x weekly - 1 sets - 5-10 reps - 2 seconds hold - Standing Balance with Eyes Closed  - 1 x daily - 4 x weekly - 1 sets - 3 reps - 10 seconds hold - Feet Apart with Eyes Closed with Head Motions  - 1 x daily - 4 x weekly - 1 sets - 5-10 reps - 2 seconds hold - Wide stance on Foam Pad head movements  - 1 x daily - 4 x weekly - 1 sets - 5-10 reps - 2 seconds hold  ASSESSMENT: CLINICAL IMPRESSION:  Patient responded well to balance exercises today. Patient understands that quick movements cause him to be at a higher fall risk. He is still reluctant to step fwd or bwd and using  the stepping strategy when reaching forward, but he is slowly progressing. Patient will continue to benefit from skilled physical therapy to address deficits and minimize falls.   OBJECTIVE IMPAIRMENTS: Abnormal gait, cardiopulmonary status limiting activity, decreased balance, decreased endurance, decreased knowledge of use of DME, decreased mobility, decreased ROM, decreased strength, impaired flexibility, impaired sensation, postural dysfunction, and pain.   ACTIVITY LIMITATIONS: carrying, lifting, standing, stairs, transfers, and locomotion level  PARTICIPATION LIMITATIONS: meal prep, cleaning, laundry, community activity, and yard work  PERSONAL FACTORS: Age, Fitness, Past/current experiences, Time since onset of injury/illness/exacerbation, and 3+ comorbidities: see PMH are also affecting patient's functional outcome.   REHAB POTENTIAL: Good  CLINICAL DECISION MAKING: Evolving/moderate complexity  EVALUATION COMPLEXITY: Moderate   GOALS: Goals reviewed with patient? Yes  SHORT TERM GOALS: target date for Short term goals 12/07/23   1.  Patient will demonstrate independent use of home exercise program to maintain progress from in clinic treatments.  Baseline: See objective data Goal status: ongoing, 11/23/2023  2. Patient will demonstrate TUG </= 12  seconds without use of UE for turning.  Baseline: See objective data Goal status: ongoing, 11/23/2023  3. BERG score 25/56 Baseline: See objective data Goal status: ongoing, 11/23/2023  LONG TERM GOALS: target dates for all long term goals 02/04/24   1. Patient will demonstrate/report pain at worst less than or equal to 2/10 to facilitate minimal limitation in daily activity secondary to pain symptoms. Baseline: See objective data Goal status: ongoing, 11/23/2023   2. Patient will demonstrate independent use of home exercise program to facilitate ability to maintain/progress functional gains from skilled physical therapy services. Baseline: See objective data Goal status: ongoing, 11/23/2023  3.  BERG score >/= 30/56 for [facility]IC  Baseline: SEE OBJECTIVE DATA Goal status: ongoing, 11/23/2023   4. Patient will demonstrate TUG </= 10 seconds without use of UE for turning.  Baseline: See objective data Goal status: ongoing, 11/23/2023   5.  Patient will report no falls within the last 4 weeks of PT Baseline: See objective data Goal status:ongoing, 11/23/2023   6.  Patient will ambulate > 300' with appropriate assistive device with modified independence Baseline: See objective data Goal status: ongoing, 11/23/2023   PLAN:  PT FREQUENCY:  2x/week  PT DURATION: 12 weeks  PLANNED INTERVENTIONS: 97164- PT Re-evaluation, 97750- Physical Performance Testing, 97110-Therapeutic exercises, 97530- Therapeutic activity, [id number]- Neuromuscular re-education, 97535- Self Care, [id number]- Gait training, Patient/Family education, Balance training, Stair training, and DME instructions  PLAN FOR NEXT SESSION: check STGs,  Introduce reactive step strategy balance exercises. Roll up 3 towels to increase difficulty. Continue to challenge stepping strategy.   Ismael Nap, Student-PT, DPT 12/01/2023, 3:45 PM  This entire session of physical therapy was performed under the direct supervision of PT signing  evaluation /treatment. PT reviewed note and agrees.  Grayce Spatz, PT, DPT 12/01/2023, 4:47 PM

## 2023-12-03 ENCOUNTER — Encounter: Payer: Self-pay | Admitting: Physical Therapy

## 2023-12-03 ENCOUNTER — Ambulatory Visit: Admitting: Physical Therapy

## 2023-12-03 DIAGNOSIS — R2681 Unsteadiness on feet: Secondary | ICD-10-CM

## 2023-12-03 DIAGNOSIS — R2689 Other abnormalities of gait and mobility: Secondary | ICD-10-CM

## 2023-12-03 DIAGNOSIS — M6281 Muscle weakness (generalized): Secondary | ICD-10-CM | POA: Diagnosis not present

## 2023-12-03 DIAGNOSIS — R296 Repeated falls: Secondary | ICD-10-CM

## 2023-12-03 DIAGNOSIS — M25561 Pain in right knee: Secondary | ICD-10-CM

## 2023-12-03 DIAGNOSIS — G8929 Other chronic pain: Secondary | ICD-10-CM

## 2023-12-03 DIAGNOSIS — R29898 Other symptoms and signs involving the musculoskeletal system: Secondary | ICD-10-CM

## 2023-12-03 NOTE — Therapy (Addendum)
 OUTPATIENT PHYSICAL THERAPY LOWER EXTREMITY TREATMENT   Patient Name: Gabriel Evans MRN: [id number] DOB:[birth year], 83 y.o., male Today's Date: 12/03/2023  END OF SESSION:  PT End of Session - 12/03/23 1144     Visit Number 5    Number of Visits 25    Date for PT Re-Evaluation 02/04/24    Authorization Type MEDICARE AND [employer]    Progress Note Due on Visit 10    PT Start Time 1144    PT Stop Time 1232    PT Time Calculation (min) 48 min    Equipment Utilized During Treatment Gait belt    Activity Tolerance Patient tolerated treatment well    Behavior During Therapy WFL for tasks assessed/performed              Past Medical History:  Diagnosis Date   Allergy    Cataract    Diabetes mellitus without complication (HCC)    Thyroid  disease    Past Surgical History:  Procedure Laterality Date   EYE SURGERY     FRACTURE SURGERY     JOINT REPLACEMENT     VASECTOMY     Patient Active Problem List   Diagnosis Date Noted   Pain in right elbow 05/29/2022   Pain in right knee 05/29/2022   Occlusion and stenosis of bilateral carotid arteries 06/26/2021   Diabetes (HCC) 03/19/2021   Skin lesion 03/19/2021   Callus 02/28/2020   Chronic kidney disease, stage 3a (HCC) 04/07/2019   Insomnia 04/07/2019   Benign prostatic hyperplasia with lower urinary tract symptoms 12/03/2017   Chronic obstructive pulmonary disease (HCC) 04/29/2017   Abnormal gait 02/03/2017   Anxiety disorder 02/03/2017   Benign neoplasm of colon 02/03/2017   Hyperlipidemia 02/03/2017   Polyneuropathy due to type 2 diabetes mellitus (HCC) 02/03/2017   Postoperative hypothyroidism 02/03/2017    PCP: Nichole Senior, MD  REFERRING PROVIDER: Persons, Ronal Dragon, PA   REFERRING DIAG: [phone] (ICD-10-CM) - Chronic pain of right knee   THERAPY DIAG:  Unsteadiness on feet  Repeated falls  Muscle weakness (generalized)  Other abnormalities of gait and mobility  Other symptoms and signs  involving the musculoskeletal system  Chronic pain of right knee  Rationale for Evaluation and Treatment: Rehabilitation  ONSET DATE: 10/27/23 PA referral to PT  SUBJECTIVE:   SUBJECTIVE STATEMENT:  Patient overall doing well. He reports some soreness after his previous PT session. Overall no new changes or falls.  He reported having difficulty getting in / out of chairs.  Also sometimes he gets his legs crossed and has difficulty uncrossing them without getting off balance.  From Eval: Patient on 10/27/2023, reported to PA with chronic R knee and hamstring pain. Patient has history of falls and R sided weakness. He had a neurologic work up that indicated polyneuropathy. Patient reports to PT today after an initial fall estimated 8 weeks ago in which he landed on his R knee. He believes it occurred due to broken toe when stepping out of room. 4 weeks after the initial fall, he fell again on his R knee after his toe got caught when standing up from chair. His most recent fall was last night when he turned off the lights, turned around, fell and scrapped his elbow. When he stands up, he hears noise from his leg and feels unstable initially until he stands and walks. Patient notices his R knee doesn't hold his weight and reports symptoms of buckling.   PERTINENT HISTORY: Occlusion and stenosis of bilateral  carotid arteries, diabetes, polyneuropathy, chronic kidney disease stage 3a, Benign prostatic hyperplasia with lower urinary tract symptoms, COPD, Benign neoplasm of colon, Cataract, Joint replacement  PAIN:  Are you having pain? Yes: NPRS scale: 2/10 when going from sitting to standing  Pain location: hamstring and calf Pain description: tightness and racheting Aggravating factors: initially standing up  Relieving factors: tylenol and taking a few steps   PRECAUTIONS: Fall  RED FLAGS: None   WEIGHT BEARING RESTRICTIONS: No  FALLS:  Has patient fallen in last 6 months? Yes. Number of  falls 2-3x a month, injuries include broken toe, scrap on elbow, black eye  LIVING ENVIRONMENT: Lives with: lives with their spouse who has disabilities  Lives in: [place], 2 story with master bedroom and bathroom downstairs  Stairs: Yes: Internal: 8+8 steps; on right going up and External: 4-5 steps; on right going up Has following equipment at home: Single point cane, Environmental consultant - 2 wheeled, Tour manager, and Grab bars  OCCUPATION: retired   PLOF: Independent  He reports repetitive falls   PATIENT GOALS:  Get rid of R knee pain. Improve strength and balance to decrease fall frequency   NEXT MD VISIT:  tbd   OBJECTIVE:  DIAGNOSTIC FINDINGS:  10/27/2023, Radiographs of his R knee show degenerative changes with no acute fractures noted   Patient-Specific Activity Scoring Scheme  0 represents "unable to perform." 10 represents "able to perform at prior level. 0 1 2 3 4 5 6 7 8 9  10 (Date and Score)   Activity Eval     1. ADLs 5    2. Walking 5    3. Prevent falls 5   4.    5.    Score 5    Total score = sum of the activity scores/number of activities Minimum detectable change (90%CI) for average score = 2 points Minimum detectable change (90%CI) for single activity score = 3 points  COGNITION: Overall cognitive status: WFL    SENSATION: Patient states no sensation in his feet due to diabetes  MUSCLE LENGTH Hamstring (hip position at 90* and knee extending) R: - 38* L: -48*  POSTURE: rounded shoulders and forward head  LOWER EXTREMITY ROM:   ROM Right eval Left eval  Hip flexion    Hip extension    Hip abduction    Hip adduction    Hip internal rotation    Hip external rotation    Knee flexion    Knee extension    Ankle dorsiflexion P: - 6 P: - 8  Ankle plantarflexion    Ankle inversion    Ankle eversion     (Blank rows = not tested)  LOWER EXTREMITY MMT:  MMT Right eval Left eval  Hip flexion    Hip extension    Hip abduction    Hip adduction     Hip internal rotation    Hip external rotation    Knee flexion    Knee extension 5/5 5/5  Ankle dorsiflexion    Ankle plantarflexion    Ankle inversion    Ankle eversion     (Blank rows = not tested)   FUNCTIONAL TESTS:  11/23/2023 TUG: 13.25 sec with CGA Cognitive TUG:14.37 sec with CGA stopped naming during turns Gait Speed (self-selected):  1.29 ft/sec Gait Speed (fast): 1.39 ft/sec  11/09/2023 Berg Balance 21/56  GAIT Gait pattern: excessive arm swing for counterbalance BUEs, step through pattern, decreased step length- Right, decreased step length- Left, decreased stride length, decreased hip/knee flexion-  Right, decreased hip/knee flexion- Left, knee flexed in stance- Right, lateral lean- Left, trunk flexed, and wide BOS Distance walked: 100' x 2 Assistive device utilized: None Level of assistance: SBA / verbal cues needed for gait deviations  TODAY'S TREATMENT                                                                          DATE:  12/03/2023  Therapeutic activities: PT demo and verbal cues on squat exercise over chair for functional strengthening.  Patient perform 10 reps 2 sets holding chair anterior for support. PT verbally educated and demo proper technique for sitting and standing safely with proper mechanics and safety.  Patient demo and verbalized understanding after repetitions noted below. STS from 18 chair w/ arm rest with BUE support, 10x STS from 18 chair wo/ arm rest using BUEs on chair seat, 10x STS PROM 18 chair with single upper extremity support using 1 hand bw LEs to work on getting on and off a toilet, 5 times.  Neuro Re-Ed [place] w/ BUE support 3x, LUE support 1x, RUE support 1x.  Patient required cues to slow down and for correct sequence.  Patient had the most difficult time with stepping backwards. Semi-tandem stance balance exercises in // bars focused on ankle balance strategy R LE leading, L LE leading, eyes open on floor, eyes closed  on foam. 30 seconds each position.  Patient demonstrated most stability when R LE leading with eyes open.  In just 30 seconds patient showed improvement in stability on floor with eyes closed.  Updated HEP with HO, demo and verbal cues for above new exercises.  SPT verbally reviewed these new exercises with patient and gave him a physical handout with instructions.  Patient verbalized understanding.   TREATMENT                                                                          DATE:  12/01/2023  TherEx: Gastroc stretch on step, 2x30 seconds each leg Standing hamstring stretch on step in // bars, 2x30 seconds each leg  Neuro Re-Ed Semi-tandem stance balance exercises in // bars focused on ankle balance strategy Initially tried tandem stance but was unable to successfully let go of rails, so he transitioned to a semi-tandem stance R LE leading, L LE leading, eyes open on floor, eyes closed on foam. 30 seconds each position.  Patient demonstrated most stability when R LE leading with eyes open.  In just 30 seconds patient showed improvement in stability on floor with eyes closed. Exercise done to challenge ankle balance strategy Standing with 2 towels rolled together underneath patient's shoulder-width apart feet focused on hip balance strategy Head turns - diagonal, up/down, side to side 5x each with eyes open and no UE support w/ CGA Focused on keeping balance by refocusing in mirror in front. When patient looked down, he swayed the most forward.  Static eyes closed standing hip width floor. Attempting to  stay balanced for 10 seconds for 5 reps w/ no UE and w/ CGA. Max time he got was 4 seconds. Tendency to sway forward and to the Rt Exercise done to challenge hip balance strategy Standing with 2 towels rolled together underneath patient's shoulder-width apart feet focused on anticipatory postural control facilitating step strategy Stand on towel, then reaching forward/backwards until having  to Step forward/backwards with no UE support and w/ CGA working to stabilize, he required intermittent touch on //bars for balance/stabilization 5x each   TREATMENT                                                                          DATE:  11/26/2023  TherEx: Gastroc stretch on step, 3x30 seconds each leg Standing hamstring stretch on step in // bars, 3x30 seconds each leg  Neuro Re-ed Feet shoulder-width apart standing cross way on foam beam focused on hip strategy Static standing minimal to no UE support  Head turns - diagonal, up/down, side to side 5x each with eyes open and no UE support w/ CGA Focused on keeping balance by refocusing in mirror in front. When patient looked down, he swayed the most forward.  Static eyes closed standing hip width floor 10 seconds 5 reps w/ no UE and w/ CGA. Tendency to sway forward and to the Rt Feet shoulder-width apart standing cross way on foam beam focused on anticipatory postural control facilitating step strategy Stance on foam beam then Step forward and backwards with no UE support and w/ CGA working to stabilize, he required intermittent touch on //bars for balance/stabilization 10x each Stance on foam beam then lateral Step Lt and Rt with no UE support and w/ CGA, 10x each. Cued to reach out to the side and step once they feel like they're falling. Cued to not cross over w/ R LE when stepping to Lt side. Had most difficulty with stepping to the side. PT verbally educated pt on 3 balance systems including their functions and how environment challenges if 1 is impaired.  And instruction in 3 strategies used to maintain balance.  Pt verbalized understanding.    PT instructed pt to continue with HEP established last session but not to try challenging activities worked on during today's session. Pt verbalized understanding.    PATIENT EDUCATION:  Education details: POC Person educated: Patient Education method: Explanation, Verbal cues,  handout Education comprehension: verbalized understanding  HOME EXERCISE PROGRAM: Access Code: [email or web address] URL: [email or web address] Date: 12/03/2023 Prepared by: Grayce Spatz   Exercises - Gastroc Stretch on Step  - 1-2 x daily - 7 x weekly - 1 sets - 3 reps - 30 seconds hold - Seated Hamstring Stretch with Strap  - 1-2 x daily - 7 x weekly - 1 sets - 3 reps - 30 seconds hold - wide stance head motions eyes open  - 1 x daily - 4 x weekly - 1 sets - 5-10 reps - 2 seconds hold - Standing Balance with Eyes Closed  - 1 x daily - 4 x weekly - 1 sets - 3 reps - 10 seconds hold - Feet Apart with Eyes Closed with Head Motions  - 1 x daily - 4 x weekly - 1 sets -  5-10 reps - 2 seconds hold - Wide stance on Foam Pad head movements  - 1 x daily - 4 x weekly - 1 sets - 5-10 reps - 2 seconds hold - Squat with Chair and Counter Support  - 1 x daily - 4-7 x weekly - 2 sets - 10 reps - 5 seconds hold - sit to stand and stand to sit  - 1 x daily - 4-7 x weekly - 2 sets - 10 reps - 5 seconds hold - [place] with Counter Support  - 1 x daily - 4-7 x weekly - 2 sets - 10 reps  ASSESSMENT: CLINICAL IMPRESSION: Patient tolerated treatment well today.  Initially focused on sit to stand and stand to sit mechanics to help minimize any potential falls.  Patient showed improvement with tandem stance indicating progression with ankle balance strategy.  Appears to understand updated HEP.  Patient will continue to benefit from skilled physical therapy to address deficits and minimize falls.  OBJECTIVE IMPAIRMENTS: Abnormal gait, cardiopulmonary status limiting activity, decreased balance, decreased endurance, decreased knowledge of use of DME, decreased mobility, decreased ROM, decreased strength, impaired flexibility, impaired sensation, postural dysfunction, and pain.   ACTIVITY LIMITATIONS: carrying, lifting, standing, stairs, transfers, and locomotion level  PARTICIPATION LIMITATIONS: meal prep,  cleaning, laundry, community activity, and yard work  PERSONAL FACTORS: Age, Fitness, Past/current experiences, Time since onset of injury/illness/exacerbation, and 3+ comorbidities: see PMH are also affecting patient's functional outcome.   REHAB POTENTIAL: Good  CLINICAL DECISION MAKING: Evolving/moderate complexity  EVALUATION COMPLEXITY: Moderate   GOALS: Goals reviewed with patient? Yes  SHORT TERM GOALS: target date for Short term goals 12/07/23   1.  Patient will demonstrate independent use of home exercise program to maintain progress from in clinic treatments.  Baseline: See objective data Goal status: ongoing, 11/23/2023  2. Patient will demonstrate TUG </= 12 seconds without use of UE for turning.  Baseline: See objective data Goal status: ongoing, 11/23/2023  3. BERG score 25/56 Baseline: See objective data Goal status: ongoing, 11/23/2023  LONG TERM GOALS: target dates for all long term goals 02/04/24   1. Patient will demonstrate/report pain at worst less than or equal to 2/10 to facilitate minimal limitation in daily activity secondary to pain symptoms. Baseline: See objective data Goal status: ongoing, 11/23/2023   2. Patient will demonstrate independent use of home exercise program to facilitate ability to maintain/progress functional gains from skilled physical therapy services. Baseline: See objective data Goal status: ongoing, 11/23/2023  3.  BERG score >/= 30/56 for MDIC  Baseline: SEE OBJECTIVE DATA Goal status: ongoing, 11/23/2023   4. Patient will demonstrate TUG </= 10 seconds without use of UE for turning.  Baseline: See objective data Goal status: ongoing, 11/23/2023   5.  Patient will report no falls within the last 4 weeks of PT Baseline: See objective data Goal status:ongoing, 11/23/2023   6.  Patient will ambulate > 300' with appropriate assistive device with modified independence Baseline: See objective data Goal status: ongoing,  11/23/2023   PLAN:  PT FREQUENCY:  2x/week  PT DURATION: 12 weeks  PLANNED INTERVENTIONS: 97164- PT Re-evaluation, 97750- Physical Performance Testing, 97110-Therapeutic exercises, 97530- Therapeutic activity, [id number]- Neuromuscular re-education, 97535- Self Care, [id number]- Gait training, Patient/Family education, Balance training, Stair training, and DME instructions  PLAN FOR NEXT SESSION:  check STGs,  Introduce reactive step strategy balance exercises. Roll up 3 towels to increase difficulty. Continue to challenge stepping strategy.   Ismael Nap, Student-PT, DPT 12/03/2023, 3:38 PM  This entire session of physical therapy was performed under the direct supervision of PT signing evaluation /treatment. PT reviewed note and agrees.   Grayce Spatz, PT, DPT 12/03/2023, 5:11 PM

## 2023-12-08 ENCOUNTER — Encounter: Payer: Self-pay | Admitting: Physical Therapy

## 2023-12-08 ENCOUNTER — Ambulatory Visit (INDEPENDENT_AMBULATORY_CARE_PROVIDER_SITE_OTHER): Admitting: Physical Therapy

## 2023-12-08 DIAGNOSIS — R2681 Unsteadiness on feet: Secondary | ICD-10-CM | POA: Diagnosis not present

## 2023-12-08 DIAGNOSIS — G8929 Other chronic pain: Secondary | ICD-10-CM

## 2023-12-08 DIAGNOSIS — R296 Repeated falls: Secondary | ICD-10-CM

## 2023-12-08 DIAGNOSIS — M6281 Muscle weakness (generalized): Secondary | ICD-10-CM | POA: Diagnosis not present

## 2023-12-08 DIAGNOSIS — R2689 Other abnormalities of gait and mobility: Secondary | ICD-10-CM | POA: Diagnosis not present

## 2023-12-08 DIAGNOSIS — M25561 Pain in right knee: Secondary | ICD-10-CM

## 2023-12-08 DIAGNOSIS — R29898 Other symptoms and signs involving the musculoskeletal system: Secondary | ICD-10-CM

## 2023-12-08 NOTE — Therapy (Addendum)
 OUTPATIENT PHYSICAL THERAPY LOWER EXTREMITY TREATMENT   Patient Name: Gabriel Evans MRN: [id number] DOB:[birth year], 83 y.o., male Today's Date: 12/08/2023  END OF SESSION:  PT End of Session - 12/08/23 1146     Visit Number 6    Number of Visits 25    Date for PT Re-Evaluation 02/04/24    Authorization Type MEDICARE AND [employer]    Progress Note Due on Visit 10    PT Start Time 1146    PT Stop Time 1234    PT Time Calculation (min) 48 min    Equipment Utilized During Treatment Gait belt    Activity Tolerance Patient tolerated treatment well    Behavior During Therapy WFL for tasks assessed/performed               Past Medical History:  Diagnosis Date   Allergy    Cataract    Diabetes mellitus without complication (HCC)    Thyroid  disease    Past Surgical History:  Procedure Laterality Date   EYE SURGERY     FRACTURE SURGERY     JOINT REPLACEMENT     VASECTOMY     Patient Active Problem List   Diagnosis Date Noted   Pain in right elbow 05/29/2022   Pain in right knee 05/29/2022   Occlusion and stenosis of bilateral carotid arteries 06/26/2021   Diabetes (HCC) 03/19/2021   Skin lesion 03/19/2021   Callus 02/28/2020   Chronic kidney disease, stage 3a (HCC) 04/07/2019   Insomnia 04/07/2019   Benign prostatic hyperplasia with lower urinary tract symptoms 12/03/2017   Chronic obstructive pulmonary disease (HCC) 04/29/2017   Abnormal gait 02/03/2017   Anxiety disorder 02/03/2017   Benign neoplasm of colon 02/03/2017   Hyperlipidemia 02/03/2017   Polyneuropathy due to type 2 diabetes mellitus (HCC) 02/03/2017   Postoperative hypothyroidism 02/03/2017    PCP: Nichole Senior, MD  REFERRING PROVIDER: Persons, Ronal Dragon, PA   REFERRING DIAG: [phone] (ICD-10-CM) - Chronic pain of right knee   THERAPY DIAG:  Unsteadiness on feet  Repeated falls  Muscle weakness (generalized)  Other abnormalities of gait and mobility  Other symptoms and signs  involving the musculoskeletal system  Chronic pain of right knee  Rationale for Evaluation and Treatment: Rehabilitation  ONSET DATE: 10/27/23 PA referral to PT  SUBJECTIVE:   SUBJECTIVE STATEMENT:   Patient has a minor wound on Rt calf and he is unsure where it came from. Additionally he feels a knot in Rt calf. Patient is overall doing well and reports no falls.   From Eval: Patient on 10/27/2023, reported to PA with chronic R knee and hamstring pain. Patient has history of falls and R sided weakness. He had a neurologic work up that indicated polyneuropathy. Patient reports to PT today after an initial fall estimated 8 weeks ago in which he landed on his R knee. He believes it occurred due to broken toe when stepping out of room. 4 weeks after the initial fall, he fell again on his R knee after his toe got caught when standing up from chair. His most recent fall was last night when he turned off the lights, turned around, fell and scrapped his elbow. When he stands up, he hears noise from his leg and feels unstable initially until he stands and walks. Patient notices his R knee doesn't hold his weight and reports symptoms of buckling.   PERTINENT HISTORY: Occlusion and stenosis of bilateral carotid arteries, diabetes, polyneuropathy, chronic kidney disease stage 3a, Benign prostatic  hyperplasia with lower urinary tract symptoms, COPD, Benign neoplasm of colon, Cataract, Joint replacement  PAIN:  Are you having pain? Yes: NPRS scale: 2/10 when going from sitting to standing  Pain location: hamstring and calf Pain description: tightness and racheting Aggravating factors: initially standing up  Relieving factors: tylenol and taking a few steps   PRECAUTIONS: Fall  RED FLAGS: None   WEIGHT BEARING RESTRICTIONS: No  FALLS:  Has patient fallen in last 6 months? Yes. Number of falls 2-3x a month, injuries include broken toe, scrap on elbow, black eye  LIVING ENVIRONMENT: Lives with:  lives with their spouse who has disabilities  Lives in: [place], 2 story with master bedroom and bathroom downstairs  Stairs: Yes: Internal: 8+8 steps; on right going up and External: 4-5 steps; on right going up Has following equipment at home: Single point cane, Environmental consultant - 2 wheeled, Tour manager, and Grab bars  OCCUPATION: retired   PLOF: Independent  He reports repetitive falls   PATIENT GOALS:  Get rid of R knee pain. Improve strength and balance to decrease fall frequency   NEXT MD VISIT:  tbd   OBJECTIVE:  DIAGNOSTIC FINDINGS:  10/27/2023, Radiographs of his R knee show degenerative changes with no acute fractures noted   Patient-Specific Activity Scoring Scheme  0 represents "unable to perform." 10 represents "able to perform at prior level. 0 1 2 3 4 5 6 7 8 9  10 (Date and Score)   Activity Eval     1. ADLs 5    2. Walking 5    3. Prevent falls 5   4.    5.    Score 5    Total score = sum of the activity scores/number of activities Minimum detectable change (90%CI) for average score = 2 points Minimum detectable change (90%CI) for single activity score = 3 points  COGNITION: Overall cognitive status: WFL    SENSATION: Patient states no sensation in his feet due to diabetes  MUSCLE LENGTH Hamstring (hip position at 90* and knee extending) R: - 38* L: -48*  POSTURE: rounded shoulders and forward head  LOWER EXTREMITY ROM:   ROM Right eval Left eval  Hip flexion    Hip extension    Hip abduction    Hip adduction    Hip internal rotation    Hip external rotation    Knee flexion    Knee extension    Ankle dorsiflexion P: - 6 P: - 8  Ankle plantarflexion    Ankle inversion    Ankle eversion     (Blank rows = not tested)  LOWER EXTREMITY MMT:  MMT Right eval Left eval  Hip flexion    Hip extension    Hip abduction    Hip adduction    Hip internal rotation    Hip external rotation    Knee flexion    Knee extension 5/5 5/5  Ankle  dorsiflexion    Ankle plantarflexion    Ankle inversion    Ankle eversion     (Blank rows = not tested)   FUNCTIONAL TESTS:  12/08/2023:  Timed Up & Go: 12.13 seconds no AD  Berg Balance 39/56  [facility] PT Assessment - 12/08/23 1151       Standardized Balance Assessment   Standardized Balance Assessment Berg Balance Test      Berg Balance Test   Sit to Stand Able to stand without using hands and stabilize independently    Standing Unsupported Able to stand safely  2 minutes    Sitting with Back Unsupported but Feet Supported on Floor or Stool Able to sit safely and securely 2 minutes    Stand to Sit Sits safely with minimal use of hands    Transfers Able to transfer safely, minor use of hands    Standing Unsupported with Eyes Closed Able to stand 10 seconds with supervision    Standing Unsupported with Feet Together Needs help to attain position but able to stand for 30 seconds with feet together    From Standing, Reach Forward with Outstretched Arm Can reach confidently >25 cm (10)    From Standing Position, Pick up Object from Floor Able to pick up shoe, needs supervision    From Standing Position, Turn to Look Behind Over each Shoulder Turn sideways only but maintains balance    Turn 360 Degrees Able to turn 360 degrees safely but slowly    Standing Unsupported, Alternately Place Feet on Step/Stool Able to complete 4 steps without aid or supervision    Standing Unsupported, One Foot in Front Needs help to step but can hold 15 seconds    Standing on One Leg Tries to lift leg/unable to hold 3 seconds but remains standing independently    Total Score 39           11/23/2023 TUG: 13.25 sec with CGA Cognitive TUG:14.37 sec with CGA stopped naming during turns Gait Speed (self-selected):  1.29 ft/sec Gait Speed (fast): 1.39 ft/sec  11/09/2023 Lars Balance 21/56  GAIT Gait pattern: excessive arm swing for counterbalance BUEs, step through pattern, decreased step length- Right,  decreased step length- Left, decreased stride length, decreased hip/knee flexion- Right, decreased hip/knee flexion- Left, knee flexed in stance- Right, lateral lean- Left, trunk flexed, and wide BOS Distance walked: 100' x 2 Assistive device utilized: None Level of assistance: SBA / verbal cues needed for gait deviations  TODAY'S TREATMENT                                                                          DATE:  12/08/2023  Neuro Re-Ed: Standing with 3 towels rolled together underneath patient's shoulder-width apart feet focused on hip balance strategy Head turns - diagonal, up/down, side to side 5x each with eyes open and no UE support w/ CGA Focused on keeping balance by refocusing in mirror in front. When patient looked up and down, he swayed the most forward.  4 square stepping in fwd, lateral, and bwd directions w/ CGA. Going CW and CCW 5x each way. Patient had various LOB forward when Rt foot was stepping back. Min to ModA for LOB. He had a difficult time stepping back in general [place] w/ BUE support 5x. Patient required cues to slow down and for correct sequence.  Patient had the most difficult time with stepping backwards.  Physical Performance BERG and TUG performed   TREATMENT  DATE:  12/03/2023  Therapeutic activities: PT demo and verbal cues on squat exercise over chair for functional strengthening.  Patient perform 10 reps 2 sets holding chair anterior for support. PT verbally educated and demo proper technique for sitting and standing safely with proper mechanics and safety.  Patient demo and verbalized understanding after repetitions noted below. STS from 18 chair w/ arm rest with BUE support, 10x STS from 18 chair wo/ arm rest using BUEs on chair seat, 10x STS PROM 18 chair with single upper extremity support using 1 hand bw LEs to work on getting on and off a toilet, 5 times.  Neuro  Re-Ed [place] w/ BUE support 3x, LUE support 1x, RUE support 1x.  Patient required cues to slow down and for correct sequence.  Patient had the most difficult time with stepping backwards. Semi-tandem stance balance exercises in // bars focused on ankle balance strategy R LE leading, L LE leading, eyes open on floor, eyes closed on foam. 30 seconds each position.  Patient demonstrated most stability when R LE leading with eyes open.  In just 30 seconds patient showed improvement in stability on floor with eyes closed.  Updated HEP with HO, demo and verbal cues for above new exercises.  SPT verbally reviewed these new exercises with patient and gave him a physical handout with instructions.  Patient verbalized understanding.   TREATMENT                                                                          DATE:  12/01/2023  TherEx: Gastroc stretch on step, 2x30 seconds each leg Standing hamstring stretch on step in // bars, 2x30 seconds each leg  Neuro Re-Ed Semi-tandem stance balance exercises in // bars focused on ankle balance strategy Initially tried tandem stance but was unable to successfully let go of rails, so he transitioned to a semi-tandem stance R LE leading, L LE leading, eyes open on floor, eyes closed on foam. 30 seconds each position.  Patient demonstrated most stability when R LE leading with eyes open.  In just 30 seconds patient showed improvement in stability on floor with eyes closed. Exercise done to challenge ankle balance strategy Standing with 2 towels rolled together underneath patient's shoulder-width apart feet focused on hip balance strategy Head turns - diagonal, up/down, side to side 5x each with eyes open and no UE support w/ CGA Focused on keeping balance by refocusing in mirror in front. When patient looked down, he swayed the most forward.  Static eyes closed standing hip width floor. Attempting to stay balanced for 10 seconds for 5 reps w/ no UE and w/ CGA.  Max time he got was 4 seconds. Tendency to sway forward and to the Rt Exercise done to challenge hip balance strategy Standing with 2 towels rolled together underneath patient's shoulder-width apart feet focused on anticipatory postural control facilitating step strategy Stand on towel, then reaching forward/backwards until having to Step forward/backwards with no UE support and w/ CGA working to stabilize, he required intermittent touch on //bars for balance/stabilization 5x each  PATIENT EDUCATION:  Education details: POC Person educated: Patient Education method: Programmer, multimedia, Verbal cues, handout Education comprehension: verbalized understanding  HOME EXERCISE PROGRAM: Access Code: [email or web address]  URL: [email or web address] Date: 12/03/2023 Prepared by: Grayce Spatz   Exercises - Gastroc Stretch on Step  - 1-2 x daily - 7 x weekly - 1 sets - 3 reps - 30 seconds hold - Seated Hamstring Stretch with Strap  - 1-2 x daily - 7 x weekly - 1 sets - 3 reps - 30 seconds hold - wide stance head motions eyes open  - 1 x daily - 4 x weekly - 1 sets - 5-10 reps - 2 seconds hold - Standing Balance with Eyes Closed  - 1 x daily - 4 x weekly - 1 sets - 3 reps - 10 seconds hold - Feet Apart with Eyes Closed with Head Motions  - 1 x daily - 4 x weekly - 1 sets - 5-10 reps - 2 seconds hold - Wide stance on Foam Pad head movements  - 1 x daily - 4 x weekly - 1 sets - 5-10 reps - 2 seconds hold - Squat with Chair and Counter Support  - 1 x daily - 4-7 x weekly - 2 sets - 10 reps - 5 seconds hold - sit to stand and stand to sit  - 1 x daily - 4-7 x weekly - 2 sets - 10 reps - 5 seconds hold - [place] with Counter Support  - 1 x daily - 4-7 x weekly - 2 sets - 10 reps  ASSESSMENT: CLINICAL IMPRESSION:  Patient tolerated treatment today. He improved in both his BERG balance score and TUG score. Although he improved, his BERG score is still below 45 which is the cut off score for fall risk in older  adults. He will continue to benefit from balance exercises to improve that score. He is progressing well and will continue to benefit from skilled PT to address impairments and minimize falls.  OBJECTIVE IMPAIRMENTS: Abnormal gait, cardiopulmonary status limiting activity, decreased balance, decreased endurance, decreased knowledge of use of DME, decreased mobility, decreased ROM, decreased strength, impaired flexibility, impaired sensation, postural dysfunction, and pain.   ACTIVITY LIMITATIONS: carrying, lifting, standing, stairs, transfers, and locomotion level  PARTICIPATION LIMITATIONS: meal prep, cleaning, laundry, community activity, and yard work  PERSONAL FACTORS: Age, Fitness, Past/current experiences, Time since onset of injury/illness/exacerbation, and 3+ comorbidities: see PMH are also affecting patient's functional outcome.   REHAB POTENTIAL: Good  CLINICAL DECISION MAKING: Evolving/moderate complexity  EVALUATION COMPLEXITY: Moderate   GOALS: Goals reviewed with patient? Yes  SHORT TERM GOALS: target date for Short term goals 12/07/23   1.  Patient will demonstrate independent use of home exercise program to maintain progress from in clinic treatments.  Baseline: See objective data Goal status: MET, 12/08/2023  2. Patient will demonstrate TUG </= 12 seconds without use of UE for turning.  Baseline: See objective data Goal status: NOT MET, 12/08/2023  3. BERG score >/= 25/56 Baseline: See objective data Goal status: MET, 12/08/2023  LONG TERM GOALS: target dates for all long term goals 02/04/24   1. Patient will demonstrate/report pain at worst less than or equal to 2/10 to facilitate minimal limitation in daily activity secondary to pain symptoms. Baseline: See objective data Goal status: ongoing, 11/23/2023   2. Patient will demonstrate independent use of home exercise program to facilitate ability to maintain/progress functional gains from skilled physical therapy  services. Baseline: See objective data Goal status: ongoing, 11/23/2023  3.  BERG score >/= 30/56 for [facility]IC  Baseline: SEE OBJECTIVE DATA Goal status: ongoing, 11/23/2023   4. Patient will demonstrate TUG </= 10  seconds without use of UE for turning.  Baseline: See objective data Goal status: ongoing, 11/23/2023   5.  Patient will report no falls within the last 4 weeks of PT Baseline: See objective data Goal status:ongoing, 11/23/2023   6.  Patient will ambulate > 300' with appropriate assistive device with modified independence Baseline: See objective data Goal status: ongoing, 11/23/2023   PLAN:  PT FREQUENCY:  2x/week  PT DURATION: 12 weeks  PLANNED INTERVENTIONS: 97164- PT Re-evaluation, 97750- Physical Performance Testing, 97110-Therapeutic exercises, 97530- Therapeutic activity, 97112- Neuromuscular re-education, [id number]- Self Care, [id number]- Gait training, Patient/Family education, Balance training, Stair training, and DME instructions  PLAN FOR NEXT SESSION: Introduce reactive step strategy balance exercises   Ismael Nap, Student-PT, DPT 12/08/2023, 4:50 PM  This entire session of physical therapy was performed under the direct supervision of PT signing evaluation /treatment. PT reviewed note and agrees.   Grayce Spatz, PT, DPT 12/08/2023, 9:57 PM

## 2023-12-10 ENCOUNTER — Ambulatory Visit: Admitting: Physical Therapy

## 2023-12-10 ENCOUNTER — Encounter: Payer: Self-pay | Admitting: Physical Therapy

## 2023-12-10 DIAGNOSIS — G8929 Other chronic pain: Secondary | ICD-10-CM

## 2023-12-10 DIAGNOSIS — R2681 Unsteadiness on feet: Secondary | ICD-10-CM | POA: Diagnosis not present

## 2023-12-10 DIAGNOSIS — M6281 Muscle weakness (generalized): Secondary | ICD-10-CM

## 2023-12-10 DIAGNOSIS — R296 Repeated falls: Secondary | ICD-10-CM

## 2023-12-10 DIAGNOSIS — R29898 Other symptoms and signs involving the musculoskeletal system: Secondary | ICD-10-CM

## 2023-12-10 DIAGNOSIS — R2689 Other abnormalities of gait and mobility: Secondary | ICD-10-CM | POA: Diagnosis not present

## 2023-12-10 DIAGNOSIS — M25561 Pain in right knee: Secondary | ICD-10-CM

## 2023-12-10 NOTE — Therapy (Addendum)
 OUTPATIENT PHYSICAL THERAPY LOWER EXTREMITY TREATMENT   Patient Name: Gabriel Evans MRN: [id number] DOB:1940/08/16, 83 y.o., male Today's Date: 12/10/2023  END OF SESSION:  PT End of Session - 12/10/23 1148     Visit Number 7    Number of Visits 25    Date for PT Re-Evaluation 02/04/24    Authorization Type MEDICARE AND [employer]    Progress Note Due on Visit 10    PT Start Time 1148    PT Stop Time 1234    PT Time Calculation (min) 46 min    Equipment Utilized During Treatment Gait belt    Activity Tolerance Patient tolerated treatment well    Behavior During Therapy WFL for tasks assessed/performed          Past Medical History:  Diagnosis Date   Allergy    Cataract    Diabetes mellitus without complication (HCC)    Thyroid  disease    Past Surgical History:  Procedure Laterality Date   EYE SURGERY     FRACTURE SURGERY     JOINT REPLACEMENT     VASECTOMY     Patient Active Problem List   Diagnosis Date Noted   Pain in right elbow 05/29/2022   Pain in right knee 05/29/2022   Occlusion and stenosis of bilateral carotid arteries 06/26/2021   Diabetes (HCC) 03/19/2021   Skin lesion 03/19/2021   Callus 02/28/2020   Chronic kidney disease, stage 3a (HCC) 04/07/2019   Insomnia 04/07/2019   Benign prostatic hyperplasia with lower urinary tract symptoms 12/03/2017   Chronic obstructive pulmonary disease (HCC) 04/29/2017   Abnormal gait 02/03/2017   Anxiety disorder 02/03/2017   Benign neoplasm of colon 02/03/2017   Hyperlipidemia 02/03/2017   Polyneuropathy due to type 2 diabetes mellitus (HCC) 02/03/2017   Postoperative hypothyroidism 02/03/2017    PCP: Nichole Senior, MD  REFERRING PROVIDER: Persons, Ronal Dragon, PA   REFERRING DIAG: [phone] (ICD-10-CM) - Chronic pain of right knee   THERAPY DIAG:  Unsteadiness on feet  Repeated falls  Muscle weakness (generalized)  Other abnormalities of gait and mobility  Other symptoms and signs involving the  musculoskeletal system  Chronic pain of right knee  Rationale for Evaluation and Treatment: Rehabilitation  ONSET DATE: 10/27/23 PA referral to PT  SUBJECTIVE:   SUBJECTIVE STATEMENT:  Patient overall doing well. He reports having difficulty with two exercises on his HEP. Patient reports a near fall getting up from a chair. As he was getting up, he started falling forward but was able to catch himself with his hands. No injuries sustained.   From Eval: Patient on 10/27/2023, reported to PA with chronic R knee and hamstring pain. Patient has history of falls and R sided weakness. He had a neurologic work up that indicated polyneuropathy. Patient reports to PT today after an initial fall estimated 8 weeks ago in which he landed on his R knee. He believes it occurred due to broken toe when stepping out of room. 4 weeks after the initial fall, he fell again on his R knee after his toe got caught when standing up from chair. His most recent fall was last night when he turned off the lights, turned around, fell and scrapped his elbow. When he stands up, he hears noise from his leg and feels unstable initially until he stands and walks. Patient notices his R knee doesn't hold his weight and reports symptoms of buckling.   PERTINENT HISTORY: Occlusion and stenosis of bilateral carotid arteries, diabetes, polyneuropathy, chronic  kidney disease stage 3a, Benign prostatic hyperplasia with lower urinary tract symptoms, COPD, Benign neoplasm of colon, Cataract, Joint replacement  PAIN:  Are you having pain? Yes: NPRS scale: 2/10 when going from sitting to standing  Pain location: hamstring and calf Pain description: tightness and racheting Aggravating factors: initially standing up  Relieving factors: tylenol and taking a few steps   PRECAUTIONS: Fall  RED FLAGS: None   WEIGHT BEARING RESTRICTIONS: No  FALLS:  Has patient fallen in last 6 months? Yes. Number of falls 2-3x a month, injuries include  broken toe, scrap on elbow, black eye  LIVING ENVIRONMENT: Lives with: lives with their spouse who has disabilities  Lives in: [place], 2 story with master bedroom and bathroom downstairs  Stairs: Yes: Internal: 8+8 steps; on right going up and External: 4-5 steps; on right going up Has following equipment at home: Single point cane, Environmental consultant - 2 wheeled, Tour manager, and Grab bars  OCCUPATION: retired   PLOF: Independent  He reports repetitive falls   PATIENT GOALS:  Get rid of R knee pain. Improve strength and balance to decrease fall frequency   NEXT MD VISIT:  tbd   OBJECTIVE:  DIAGNOSTIC FINDINGS:  10/27/2023, Radiographs of his R knee show degenerative changes with no acute fractures noted   Patient-Specific Activity Scoring Scheme  0 represents "unable to perform." 10 represents "able to perform at prior level. 0 1 2 3 4 5 6 7 8 9  10 (Date and Score)   Activity Eval     1. ADLs 5    2. Walking 5    3. Prevent falls 5   4.    5.    Score 5    Total score = sum of the activity scores/number of activities Minimum detectable change (90%CI) for average score = 2 points Minimum detectable change (90%CI) for single activity score = 3 points  COGNITION: Overall cognitive status: WFL    SENSATION: Patient states no sensation in his feet due to diabetes  MUSCLE LENGTH Hamstring (hip position at 90* and knee extending) R: - 38* L: -48*  POSTURE: rounded shoulders and forward head  LOWER EXTREMITY ROM:   ROM Right eval Left eval  Hip flexion    Hip extension    Hip abduction    Hip adduction    Hip internal rotation    Hip external rotation    Knee flexion    Knee extension    Ankle dorsiflexion P: - 6 P: - 8  Ankle plantarflexion    Ankle inversion    Ankle eversion     (Blank rows = not tested)  LOWER EXTREMITY MMT:  MMT Right eval Left eval  Hip flexion    Hip extension    Hip abduction    Hip adduction    Hip internal rotation    Hip  external rotation    Knee flexion    Knee extension 5/5 5/5  Ankle dorsiflexion    Ankle plantarflexion    Ankle inversion    Ankle eversion     (Blank rows = not tested)   FUNCTIONAL TESTS:  12/08/2023:  Timed Up & Go: 12.13 seconds no AD  Berg Balance 39/56   11/23/2023 TUG: 13.25 sec with CGA Cognitive TUG:14.37 sec with CGA stopped naming during turns Gait Speed (self-selected):  1.29 ft/sec Gait Speed (fast): 1.39 ft/sec  11/09/2023 Berg Balance 21/56  GAIT Gait pattern: excessive arm swing for counterbalance BUEs, step through pattern, decreased step length-  Right, decreased step length- Left, decreased stride length, decreased hip/knee flexion- Right, decreased hip/knee flexion- Left, knee flexed in stance- Right, lateral lean- Left, trunk flexed, and wide BOS Distance walked: 100' x 2 Assistive device utilized: None Level of assistance: SBA / verbal cues needed for gait deviations  TODAY'S TREATMENT                                                                          DATE:  12/10/2023  Therapeutic Activities: 4 box step up/down w/ Rt LE leading. Patient had difficulty with stepping down due to decreased eccentric control/strength. 2x10 TKE, 3x10 with green resistance band. Cues to slowly straighten the knee instead of snapping back.  Neuro Re-Ed SPT verbally reviewed and demo grapevine exercise that is in HEP. Patient still had knee pain with the exercise. Discontinued this exercise for that reason Star cone taps: 10 rounds each leg. Patient had difficulty controlled knee flexion during stance time due to decreased eccentric control. BUE support //bars.    TREATMENT                                                                          DATE:  12/08/2023  Neuro Re-Ed: Standing with 3 towels rolled together underneath patient's shoulder-width apart feet focused on hip balance strategy Head turns - diagonal, up/down, side to side 5x each with eyes open and no UE  support w/ CGA Focused on keeping balance by refocusing in mirror in front. When patient looked up and down, he swayed the most forward.  4 square stepping in fwd, lateral, and bwd directions w/ CGA. Going CW and CCW 5x each way. Patient had various LOB forward when Rt foot was stepping back. Min to ModA for LOB. He had a difficult time stepping back in general Grapevine w/ BUE support 5x. Patient required cues to slow down and for correct sequence.  Patient had the most difficult time with stepping backwards.  Physical Performance BERG and TUG performed   TREATMENT                                                                          DATE:  12/03/2023  Therapeutic activities: PT demo and verbal cues on squat exercise over chair for functional strengthening.  Patient perform 10 reps 2 sets holding chair anterior for support. PT verbally educated and demo proper technique for sitting and standing safely with proper mechanics and safety.  Patient demo and verbalized understanding after repetitions noted below. STS from 18 chair w/ arm rest with BUE support, 10x STS from 18 chair wo/ arm rest using BUEs on chair seat, 10x STS PROM 18 chair with single upper  extremity support using 1 hand bw LEs to work on getting on and off a toilet, 5 times.  Neuro Re-Ed Grapevine w/ BUE support 3x, LUE support 1x, RUE support 1x.  Patient required cues to slow down and for correct sequence.  Patient had the most difficult time with stepping backwards. Semi-tandem stance balance exercises in // bars focused on ankle balance strategy R LE leading, L LE leading, eyes open on floor, eyes closed on foam. 30 seconds each position.  Patient demonstrated most stability when R LE leading with eyes open.  In just 30 seconds patient showed improvement in stability on floor with eyes closed.  Updated HEP with HO, demo and verbal cues for above new exercises.  SPT verbally reviewed these new exercises with patient and  gave him a physical handout with instructions.  Patient verbalized understanding.  PATIENT EDUCATION:  Education details: POC Person educated: Patient Education method: Explanation, Verbal cues, handout Education comprehension: verbalized understanding  HOME EXERCISE PROGRAM: Access Code: [email or web address] URL: [email or web address] Date: 12/10/2023 Prepared by: Gabriel Theophilus Stallion  Exercises - Gastroc Stretch on Step  - 1-2 x daily - 7 x weekly - 1 sets - 3 reps - 30 seconds hold - Seated Hamstring Stretch with Strap  - 1-2 x daily - 7 x weekly - 1 sets - 3 reps - 30 seconds hold - wide stance head motions eyes open  - 1 x daily - 4 x weekly - 1 sets - 5-10 reps - 2 seconds hold - Standing Balance with Eyes Closed  - 1 x daily - 4 x weekly - 1 sets - 3 reps - 10 seconds hold - Feet Apart with Eyes Closed with Head Motions  - 1 x daily - 4 x weekly - 1 sets - 5-10 reps - 2 seconds hold - Wide stance on Foam Pad head movements  - 1 x daily - 4 x weekly - 1 sets - 5-10 reps - 2 seconds hold - Squat with Chair and Counter Support  - 1 x daily - 4-7 x weekly - 2 sets - 10 reps - 5 seconds hold - sit to stand and stand to sit  - 1 x daily - 4-7 x weekly - 2 sets - 10 reps - 5 seconds hold - Carioca with Counter Support  - 1 x daily - 4-7 x weekly - 2 sets - 10 reps - Standing Terminal Knee Extension with Resistance  - 1 x daily - 7 x weekly - 2-3 sets - 10 reps - 3-5 seconds hold  ASSESSMENT: CLINICAL IMPRESSION:  Patient tolerated treatment well today. Patient has difficulty with eccentric control/strengthening of the Rt knee and has pain due to it. He will benefit from more exercises challenging him in that area. Patient will continue to benefit from skilled PT to address impairments and minimize falls.   OBJECTIVE IMPAIRMENTS: Abnormal gait, cardiopulmonary status limiting activity, decreased balance, decreased endurance, decreased knowledge of use of DME, decreased mobility,  decreased ROM, decreased strength, impaired flexibility, impaired sensation, postural dysfunction, and pain.   ACTIVITY LIMITATIONS: carrying, lifting, standing, stairs, transfers, and locomotion level  PARTICIPATION LIMITATIONS: meal prep, cleaning, laundry, community activity, and yard work  PERSONAL FACTORS: Age, Fitness, Past/current experiences, Time since onset of injury/illness/exacerbation, and 3+ comorbidities: see PMH are also affecting patient's functional outcome.   REHAB POTENTIAL: Good  CLINICAL DECISION MAKING: Evolving/moderate complexity  EVALUATION COMPLEXITY: Moderate   GOALS: Goals reviewed with patient? Yes  SHORT TERM GOALS: target date for  Short term goals 12/07/23   1.  Patient will demonstrate independent use of home exercise program to maintain progress from in clinic treatments.  Baseline: See objective data Goal status: MET, 12/08/2023  2. Patient will demonstrate TUG </= 12 seconds without use of UE for turning.  Baseline: See objective data Goal status: NOT MET, 12/08/2023  3. BERG score >/= 25/56 Baseline: See objective data Goal status: MET, 12/08/2023  LONG TERM GOALS: target dates for all long term goals 02/04/24   1. Patient will demonstrate/report pain at worst less than or equal to 2/10 to facilitate minimal limitation in daily activity secondary to pain symptoms. Baseline: See objective data Goal status: ongoing, 11/23/2023   2. Patient will demonstrate independent use of home exercise program to facilitate ability to maintain/progress functional gains from skilled physical therapy services. Baseline: See objective data Goal status: ongoing, 11/23/2023  3.  BERG score >/= 30/56 for [facility]  Baseline: SEE OBJECTIVE DATA Goal status: ongoing, 11/23/2023   4. Patient will demonstrate TUG </= 10 seconds without use of UE for turning.  Baseline: See objective data Goal status: ongoing, 11/23/2023   5.  Patient will report no falls within the  last 4 weeks of PT Baseline: See objective data Goal status:ongoing, 11/23/2023   6.  Patient will ambulate > 300' with appropriate assistive device with modified independence Baseline: See objective data Goal status: ongoing, 11/23/2023   PLAN:  PT FREQUENCY:  2x/week  PT DURATION: 12 weeks  PLANNED INTERVENTIONS: 97164- PT Re-evaluation, 97750- Physical Performance Testing, 97110-Therapeutic exercises, 97530- Therapeutic activity, 97112- Neuromuscular re-education, [phone]- Self Care, [id number]- Gait training, Patient/Family education, Balance training, Stair training, and DME instructions  PLAN FOR NEXT SESSION: continue balance activities facilitating ankle, hip & step strategies,  work on eccentric control/strengthening exercises, check in w/ HEP   Gabriel Evans, Student-PT, DPT 12/10/2023, 4:00 PM  This entire session of physical therapy was performed under the direct supervision of PT signing evaluation /treatment. PT reviewed note and agrees.   Gabriel Evans, PT, DPT 12/10/2023, 7:19 PM

## 2023-12-15 ENCOUNTER — Encounter: Payer: Self-pay | Admitting: Physical Therapy

## 2023-12-15 ENCOUNTER — Ambulatory Visit: Admitting: Physical Therapy

## 2023-12-15 DIAGNOSIS — R2689 Other abnormalities of gait and mobility: Secondary | ICD-10-CM | POA: Diagnosis not present

## 2023-12-15 DIAGNOSIS — R29898 Other symptoms and signs involving the musculoskeletal system: Secondary | ICD-10-CM

## 2023-12-15 DIAGNOSIS — M6281 Muscle weakness (generalized): Secondary | ICD-10-CM

## 2023-12-15 DIAGNOSIS — G8929 Other chronic pain: Secondary | ICD-10-CM

## 2023-12-15 DIAGNOSIS — M25561 Pain in right knee: Secondary | ICD-10-CM

## 2023-12-15 DIAGNOSIS — R2681 Unsteadiness on feet: Secondary | ICD-10-CM

## 2023-12-15 DIAGNOSIS — R296 Repeated falls: Secondary | ICD-10-CM | POA: Diagnosis not present

## 2023-12-15 NOTE — Therapy (Signed)
 OUTPATIENT PHYSICAL THERAPY LOWER EXTREMITY TREATMENT   Patient Name: Gabriel Evans MRN: [id number] DOB:[birth year], 83 y.o., male Today's Date: 12/15/2023  END OF SESSION:  PT End of Session - 12/15/23 1143     Visit Number 8    Number of Visits 25    Date for PT Re-Evaluation 02/04/24    Authorization Type MEDICARE AND [employer]    Progress Note Due on Visit 10    PT Start Time 1144    PT Stop Time 1224    PT Time Calculation (min) 40 min    Equipment Utilized During Treatment Gait belt    Activity Tolerance Patient tolerated treatment well    Behavior During Therapy WFL for tasks assessed/performed          Past Medical History:  Diagnosis Date   Allergy    Cataract    Diabetes mellitus without complication (HCC)    Thyroid  disease    Past Surgical History:  Procedure Laterality Date   EYE SURGERY     FRACTURE SURGERY     JOINT REPLACEMENT     VASECTOMY     Patient Active Problem List   Diagnosis Date Noted   Pain in right elbow 05/29/2022   Pain in right knee 05/29/2022   Occlusion and stenosis of bilateral carotid arteries 06/26/2021   Diabetes (HCC) 03/19/2021   Skin lesion 03/19/2021   Callus 02/28/2020   Chronic kidney disease, stage 3a (HCC) 04/07/2019   Insomnia 04/07/2019   Benign prostatic hyperplasia with lower urinary tract symptoms 12/03/2017   Chronic obstructive pulmonary disease (HCC) 04/29/2017   Abnormal gait 02/03/2017   Anxiety disorder 02/03/2017   Benign neoplasm of colon 02/03/2017   Hyperlipidemia 02/03/2017   Polyneuropathy due to type 2 diabetes mellitus (HCC) 02/03/2017   Postoperative hypothyroidism 02/03/2017    PCP: Nichole Senior, MD  REFERRING PROVIDER: Persons, Ronal Dragon, PA   REFERRING DIAG: [phone] (ICD-10-CM) - Chronic pain of right knee   THERAPY DIAG:  Unsteadiness on feet  Repeated falls  Muscle weakness (generalized)  Other abnormalities of gait and mobility  Other symptoms and signs involving the  musculoskeletal system  Chronic pain of right knee  Rationale for Evaluation and Treatment: Rehabilitation  ONSET DATE: 10/27/23 PA referral to PT  SUBJECTIVE:   SUBJECTIVE STATEMENT:  He has muscle soreness.    From Eval: Patient on 10/27/2023, reported to PA with chronic R knee and hamstring pain. Patient has history of falls and R sided weakness. He had a neurologic work up that indicated polyneuropathy. Patient reports to PT today after an initial fall estimated 8 weeks ago in which he landed on his R knee. He believes it occurred due to broken toe when stepping out of room. 4 weeks after the initial fall, he fell again on his R knee after his toe got caught when standing up from chair. His most recent fall was last night when he turned off the lights, turned around, fell and scrapped his elbow. When he stands up, he hears noise from his leg and feels unstable initially until he stands and walks. Patient notices his R knee doesn't hold his weight and reports symptoms of buckling.   PERTINENT HISTORY: Occlusion and stenosis of bilateral carotid arteries, diabetes, polyneuropathy, chronic kidney disease stage 3a, Benign prostatic hyperplasia with lower urinary tract symptoms, COPD, Benign neoplasm of colon, Cataract, Joint replacement  PAIN:  Are you having pain? Yes: NPRS scale: 2/10 when going from sitting to standing  Pain location:  hamstring and calf Pain description: tightness and racheting Aggravating factors: initially standing up  Relieving factors: tylenol and taking a few steps   PRECAUTIONS: Fall  RED FLAGS: None   WEIGHT BEARING RESTRICTIONS: No  FALLS:  Has patient fallen in last 6 months? Yes. Number of falls 2-3x a month, injuries include broken toe, scrap on elbow, black eye  LIVING ENVIRONMENT: Lives with: lives with their spouse who has disabilities  Lives in: [place], 2 story with master bedroom and bathroom downstairs  Stairs: Yes: Internal: 8+8 steps; on right  going up and External: 4-5 steps; on right going up Has following equipment at home: Single point cane, Environmental consultant - 2 wheeled, Tour manager, and Grab bars  OCCUPATION: retired   PLOF: Independent  He reports repetitive falls   PATIENT GOALS:  Get rid of R knee pain. Improve strength and balance to decrease fall frequency   NEXT MD VISIT:  tbd   OBJECTIVE:  DIAGNOSTIC FINDINGS:  10/27/2023, Radiographs of his R knee show degenerative changes with no acute fractures noted   Patient-Specific Activity Scoring Scheme  0 represents "unable to perform." 10 represents "able to perform at prior level. 0 1 2 3 4 5 6 7 8 9  10 (Date and Score)   Activity Eval     1. ADLs 5    2. Walking 5    3. Prevent falls 5   4.    5.    Score 5    Total score = sum of the activity scores/number of activities Minimum detectable change (90%CI) for average score = 2 points Minimum detectable change (90%CI) for single activity score = 3 points  COGNITION: Overall cognitive status: WFL    SENSATION: Patient states no sensation in his feet due to diabetes  MUSCLE LENGTH Hamstring (hip position at 90* and knee extending) R: - 38* L: -48*  POSTURE: rounded shoulders and forward head  LOWER EXTREMITY ROM:   ROM Right eval Left eval  Hip flexion    Hip extension    Hip abduction    Hip adduction    Hip internal rotation    Hip external rotation    Knee flexion    Knee extension    Ankle dorsiflexion P: - 6 P: - 8  Ankle plantarflexion    Ankle inversion    Ankle eversion     (Blank rows = not tested)  LOWER EXTREMITY MMT:  MMT Right eval Left eval  Hip flexion    Hip extension    Hip abduction    Hip adduction    Hip internal rotation    Hip external rotation    Knee flexion    Knee extension 5/5 5/5  Ankle dorsiflexion    Ankle plantarflexion    Ankle inversion    Ankle eversion     (Blank rows = not tested)   FUNCTIONAL TESTS:  12/08/2023:  Timed Up & Go: 12.13  seconds no AD  Berg Balance 39/56   11/23/2023 TUG: 13.25 sec with CGA Cognitive TUG:14.37 sec with CGA stopped naming during turns Gait Speed (self-selected):  1.29 ft/sec Gait Speed (fast): 1.39 ft/sec  11/09/2023 Lars Balance 21/56  GAIT Gait pattern: excessive arm swing for counterbalance BUEs, step through pattern, decreased step length- Right, decreased step length- Left, decreased stride length, decreased hip/knee flexion- Right, decreased hip/knee flexion- Left, knee flexed in stance- Right, lateral lean- Left, trunk flexed, and wide BOS Distance walked: 100' x 2 Assistive device utilized: None Level of  assistance: SBA / verbal cues needed for gait deviations  TODAY'S TREATMENT                                                                          DATE:  12/15/2023  Therapeutic Exercise: Recumbent Bike seat 8 level 3 for 8 min.  Patient reported knee pain that increased from 2/10 to 3/10 mainly behind patella.   Therapeutic Activities: 4 box step up 10 reps each LE with BUE support //bars 4 box step down 10 reps each LE with BUE support //bars Side step to right & left 4 box 10 reps with BUE support // bars BLE TKE 2x10 with green resistance band and contralateral LE tapping cone for SLS.   Neuro Re-Ed 4 square stepping over 1 inch PVC pipes with minA for balance losses.  Patient had balance losses stepping backward with either lower extremity leading and by the end he was having difficulty with lateral steps to the right and left.    TREATMENT                                                                          DATE:  12/10/2023  Therapeutic Activities: 4 box step up/down w/ Rt LE leading. Patient had difficulty with stepping down due to decreased eccentric control/strength. 2x10 TKE, 3x10 with green resistance band. Cues to slowly straighten the knee instead of snapping back.  Neuro Re-Ed SPT verbally reviewed and demo grapevine exercise that is in HEP.  Patient still had knee pain with the exercise. Discontinued this exercise for that reason Star cone taps: 10 rounds each leg. Patient had difficulty controlled knee flexion during stance time due to decreased eccentric control. BUE support //bars.    TREATMENT                                                                          DATE:  12/08/2023  Neuro Re-Ed: Standing with 3 towels rolled together underneath patient's shoulder-width apart feet focused on hip balance strategy Head turns - diagonal, up/down, side to side 5x each with eyes open and no UE support w/ CGA Focused on keeping balance by refocusing in mirror in front. When patient looked up and down, he swayed the most forward.  4 square stepping in fwd, lateral, and bwd directions w/ CGA. Going CW and CCW 5x each way. Patient had various LOB forward when Rt foot was stepping back. Min to ModA for LOB. He had a difficult time stepping back in general Grapevine w/ BUE support 5x. Patient required cues to slow down and for correct sequence.  Patient had the most difficult time with stepping backwards.  Physical Performance BERG  and TUG performed     PATIENT EDUCATION:  Education details: POC Person educated: Patient Education method: Explanation, Verbal cues, handout Education comprehension: verbalized understanding  HOME EXERCISE PROGRAM: Access Code: [email or web address] URL: [email or web address] Date: 12/10/2023 Prepared by: Ismael Theophilus Stallion  Exercises - Gastroc Stretch on Step  - 1-2 x daily - 7 x weekly - 1 sets - 3 reps - 30 seconds hold - Seated Hamstring Stretch with Strap  - 1-2 x daily - 7 x weekly - 1 sets - 3 reps - 30 seconds hold - wide stance head motions eyes open  - 1 x daily - 4 x weekly - 1 sets - 5-10 reps - 2 seconds hold - Standing Balance with Eyes Closed  - 1 x daily - 4 x weekly - 1 sets - 3 reps - 10 seconds hold - Feet Apart with Eyes Closed with Head Motions  - 1 x daily - 4 x  weekly - 1 sets - 5-10 reps - 2 seconds hold - Wide stance on Foam Pad head movements  - 1 x daily - 4 x weekly - 1 sets - 5-10 reps - 2 seconds hold - Squat with Chair and Counter Support  - 1 x daily - 4-7 x weekly - 2 sets - 10 reps - 5 seconds hold - sit to stand and stand to sit  - 1 x daily - 4-7 x weekly - 2 sets - 10 reps - 5 seconds hold - Carioca with Counter Support  - 1 x daily - 4-7 x weekly - 2 sets - 10 reps - Standing Terminal Knee Extension with Resistance  - 1 x daily - 7 x weekly - 2-3 sets - 10 reps - 3-5 seconds hold  ASSESSMENT: CLINICAL IMPRESSION: Patient is reporting improved knee control with 4 inch step.  Patient's knee pain continues to vacillate with activities.  OBJECTIVE IMPAIRMENTS: Abnormal gait, cardiopulmonary status limiting activity, decreased balance, decreased endurance, decreased knowledge of use of DME, decreased mobility, decreased ROM, decreased strength, impaired flexibility, impaired sensation, postural dysfunction, and pain.   ACTIVITY LIMITATIONS: carrying, lifting, standing, stairs, transfers, and locomotion level  PARTICIPATION LIMITATIONS: meal prep, cleaning, laundry, community activity, and yard work  PERSONAL FACTORS: Age, Fitness, Past/current experiences, Time since onset of injury/illness/exacerbation, and 3+ comorbidities: see PMH are also affecting patient's functional outcome.   REHAB POTENTIAL: Good  CLINICAL DECISION MAKING: Evolving/moderate complexity  EVALUATION COMPLEXITY: Moderate   GOALS: Goals reviewed with patient? Yes  SHORT TERM GOALS: target date for Short term goals 12/07/23   1.  Patient will demonstrate independent use of home exercise program to maintain progress from in clinic treatments.  Baseline: See objective data Goal status: MET, 12/08/2023  2. Patient will demonstrate TUG </= 12 seconds without use of UE for turning.  Baseline: See objective data Goal status: NOT MET, 12/08/2023  3. BERG score >/=  25/56 Baseline: See objective data Goal status: MET, 12/08/2023  LONG TERM GOALS: target dates for all long term goals 02/04/24   1. Patient will demonstrate/report pain at worst less than or equal to 2/10 to facilitate minimal limitation in daily activity secondary to pain symptoms. Baseline: See objective data Goal status: ongoing, 11/23/2023   2. Patient will demonstrate independent use of home exercise program to facilitate ability to maintain/progress functional gains from skilled physical therapy services. Baseline: See objective data Goal status: ongoing, 11/23/2023  3.  BERG score >/= 30/56 for [facility]IC  Baseline: SEE OBJECTIVE DATA Goal status: ongoing,  11/23/2023   4. Patient will demonstrate TUG </= 10 seconds without use of UE for turning.  Baseline: See objective data Goal status: ongoing, 11/23/2023   5.  Patient will report no falls within the last 4 weeks of PT Baseline: See objective data Goal status:ongoing, 11/23/2023   6.  Patient will ambulate > 300' with appropriate assistive device with modified independence Baseline: See objective data Goal status: ongoing, 11/23/2023   PLAN:  PT FREQUENCY:  2x/week  PT DURATION: 12 weeks  PLANNED INTERVENTIONS: 97164- PT Re-evaluation, 97750- Physical Performance Testing, 97110-Therapeutic exercises, 97530- Therapeutic activity, 97112- Neuromuscular re-education, [phone]- Self Care, [id number]- Gait training, Patient/Family education, Balance training, Stair training, and DME instructions  PLAN FOR NEXT SESSION:  continue balance activities facilitating ankle, hip & step strategies,  work on knee exercises especially eccentric control/strengthening exercises, check in w/ HEP   Grayce Spatz, PT, DPT 12/15/2023, 4:45 PM

## 2023-12-17 ENCOUNTER — Ambulatory Visit (INDEPENDENT_AMBULATORY_CARE_PROVIDER_SITE_OTHER): Admitting: Physical Therapy

## 2023-12-17 ENCOUNTER — Encounter: Payer: Self-pay | Admitting: Physical Therapy

## 2023-12-17 DIAGNOSIS — M6281 Muscle weakness (generalized): Secondary | ICD-10-CM | POA: Diagnosis not present

## 2023-12-17 DIAGNOSIS — R2681 Unsteadiness on feet: Secondary | ICD-10-CM | POA: Diagnosis not present

## 2023-12-17 DIAGNOSIS — R2689 Other abnormalities of gait and mobility: Secondary | ICD-10-CM

## 2023-12-17 DIAGNOSIS — R29898 Other symptoms and signs involving the musculoskeletal system: Secondary | ICD-10-CM

## 2023-12-17 DIAGNOSIS — G8929 Other chronic pain: Secondary | ICD-10-CM

## 2023-12-17 DIAGNOSIS — M25561 Pain in right knee: Secondary | ICD-10-CM

## 2023-12-17 DIAGNOSIS — R296 Repeated falls: Secondary | ICD-10-CM

## 2023-12-17 NOTE — Therapy (Addendum)
 OUTPATIENT PHYSICAL THERAPY LOWER EXTREMITY TREATMENT   Patient Name: Gabriel Evans MRN: [id number] DOB:[birth year], 83 y.o., male Today's Date: 12/17/2023  END OF SESSION:  PT End of Session - 12/17/23 1147     Visit Number 9    Number of Visits 25    Date for PT Re-Evaluation 02/04/24    Authorization Type MEDICARE AND [employer]    Progress Note Due on Visit 10    PT Start Time 1146    PT Stop Time 1232    PT Time Calculation (min) 46 min    Equipment Utilized During Treatment Gait belt    Activity Tolerance Patient tolerated treatment well    Behavior During Therapy WFL for tasks assessed/performed           Past Medical History:  Diagnosis Date   Allergy    Cataract    Diabetes mellitus without complication (HCC)    Thyroid  disease    Past Surgical History:  Procedure Laterality Date   EYE SURGERY     FRACTURE SURGERY     JOINT REPLACEMENT     VASECTOMY     Patient Active Problem List   Diagnosis Date Noted   Pain in right elbow 05/29/2022   Pain in right knee 05/29/2022   Occlusion and stenosis of bilateral carotid arteries 06/26/2021   Diabetes (HCC) 03/19/2021   Skin lesion 03/19/2021   Callus 02/28/2020   Chronic kidney disease, stage 3a (HCC) 04/07/2019   Insomnia 04/07/2019   Benign prostatic hyperplasia with lower urinary tract symptoms 12/03/2017   Chronic obstructive pulmonary disease (HCC) 04/29/2017   Abnormal gait 02/03/2017   Anxiety disorder 02/03/2017   Benign neoplasm of colon 02/03/2017   Hyperlipidemia 02/03/2017   Polyneuropathy due to type 2 diabetes mellitus (HCC) 02/03/2017   Postoperative hypothyroidism 02/03/2017    PCP: Nichole Senior, MD  REFERRING PROVIDER: Persons, Ronal Dragon, PA   REFERRING DIAG: [phone] (ICD-10-CM) - Chronic pain of right knee   THERAPY DIAG:  Unsteadiness on feet  Repeated falls  Muscle weakness (generalized)  Other abnormalities of gait and mobility  Other symptoms and signs involving  the musculoskeletal system  Chronic pain of right knee  Rationale for Evaluation and Treatment: Rehabilitation  ONSET DATE: 10/27/23 PA referral to PT  SUBJECTIVE:   SUBJECTIVE STATEMENT:   Patient reports soreness. He has been doing more chores and yard work which may be increasing his soreness.     From Eval: Patient on 10/27/2023, reported to PA with chronic R knee and hamstring pain. Patient has history of falls and R sided weakness. He had a neurologic work up that indicated polyneuropathy. Patient reports to PT today after an initial fall estimated 8 weeks ago in which he landed on his R knee. He believes it occurred due to broken toe when stepping out of room. 4 weeks after the initial fall, he fell again on his R knee after his toe got caught when standing up from chair. His most recent fall was last night when he turned off the lights, turned around, fell and scrapped his elbow. When he stands up, he hears noise from his leg and feels unstable initially until he stands and walks. Patient notices his R knee doesn't hold his weight and reports symptoms of buckling.   PERTINENT HISTORY: Occlusion and stenosis of bilateral carotid arteries, diabetes, polyneuropathy, chronic kidney disease stage 3a, Benign prostatic hyperplasia with lower urinary tract symptoms, COPD, Benign neoplasm of colon, Cataract, Joint replacement  PAIN:  Are you having pain? Yes: NPRS scale: 2/10 when going from sitting to standing  Pain location: hamstring and calf Pain description: tightness and racheting Aggravating factors: initially standing up  Relieving factors: tylenol and taking a few steps   PRECAUTIONS: Fall  RED FLAGS: None   WEIGHT BEARING RESTRICTIONS: No  FALLS:  Has patient fallen in last 6 months? Yes. Number of falls 2-3x a month, injuries include broken toe, scrap on elbow, black eye  LIVING ENVIRONMENT: Lives with: lives with their spouse who has disabilities  Lives in: [place], 2  story with master bedroom and bathroom downstairs  Stairs: Yes: Internal: 8+8 steps; on right going up and External: 4-5 steps; on right going up Has following equipment at home: Single point cane, Environmental consultant - 2 wheeled, Tour manager, and Grab bars  OCCUPATION: retired   PLOF: Independent  He reports repetitive falls   PATIENT GOALS:  Get rid of R knee pain. Improve strength and balance to decrease fall frequency   NEXT MD VISIT:  tbd   OBJECTIVE:  DIAGNOSTIC FINDINGS:  10/27/2023, Radiographs of his R knee show degenerative changes with no acute fractures noted   Patient-Specific Activity Scoring Scheme  0 represents "unable to perform." 10 represents "able to perform at prior level. 0 1 2 3 4 5 6 7 8 9  10 (Date and Score)   Activity Eval     1. ADLs 5    2. Walking 5    3. Prevent falls 5   4.    5.    Score 5    Total score = sum of the activity scores/number of activities Minimum detectable change (90%CI) for average score = 2 points Minimum detectable change (90%CI) for single activity score = 3 points  COGNITION: Overall cognitive status: WFL    SENSATION: Patient states no sensation in his feet due to diabetes  MUSCLE LENGTH Hamstring (hip position at 90* and knee extending) R: - 38* L: -48*  POSTURE: rounded shoulders and forward head  LOWER EXTREMITY ROM:   ROM Right eval Left eval  Hip flexion    Hip extension    Hip abduction    Hip adduction    Hip internal rotation    Hip external rotation    Knee flexion    Knee extension    Ankle dorsiflexion P: - 6 P: - 8  Ankle plantarflexion    Ankle inversion    Ankle eversion     (Blank rows = not tested)  LOWER EXTREMITY MMT:  MMT Right eval Left eval  Hip flexion    Hip extension    Hip abduction    Hip adduction    Hip internal rotation    Hip external rotation    Knee flexion    Knee extension 5/5 5/5  Ankle dorsiflexion    Ankle plantarflexion    Ankle inversion    Ankle eversion      (Blank rows = not tested)   FUNCTIONAL TESTS:  12/08/2023:  Timed Up & Go: 12.13 seconds no AD  Berg Balance 39/56   11/23/2023 TUG: 13.25 sec with CGA Cognitive TUG:14.37 sec with CGA stopped naming during turns Gait Speed (self-selected):  1.29 ft/sec Gait Speed (fast): 1.39 ft/sec  11/09/2023 Lars Balance 21/56  GAIT Gait pattern: excessive arm swing for counterbalance BUEs, step through pattern, decreased step length- Right, decreased step length- Left, decreased stride length, decreased hip/knee flexion- Right, decreased hip/knee flexion- Left, knee flexed in stance- Right, lateral lean-  Left, trunk flexed, and wide BOS Distance walked: 100' x 2 Assistive device utilized: None Level of assistance: SBA / verbal cues needed for gait deviations  TODAY'S TREATMENT                                                                          DATE:  12/17/2023  Therapeutic Exercise:  Recumbent bike seat 8, level 2 for 8 minutes  Gastroc step stretch, 2x30 seconds each leg  Hamstring stretch, 2x30 seconds each leg  Therapeutic Activities: Double leg press: 100# 1x10 for on bilateral functional strength Double leg concentric to single leg eccentric, 81#, 1x10 with ea LE - working eccentric functional control.   Single leg press, 50#, 2x10 working on single functional strength Walking backwards and side stepping to right & to left, 2 laps (25')  each. Had minimal LOB which he was able to self-correct   TREATMENT                                                                          DATE:  12/15/2023  Therapeutic Exercise: Recumbent Bike seat 8 level 3 for 8 min.  Patient reported knee pain that increased from 2/10 to 3/10 mainly behind patella.   Therapeutic Activities: 4 box step up 10 reps each LE with BUE support //bars 4 box step down 10 reps each LE with BUE support //bars Side step to right & left 4 box 10 reps with BUE support // bars BLE TKE 2x10 with green resistance  band and contralateral LE tapping cone for SLS.   Neuro Re-Ed 4 square stepping over 1 inch PVC pipes with minA for balance losses.  Patient had balance losses stepping backward with either lower extremity leading and by the end he was having difficulty with lateral steps to the right and left.    TREATMENT                                                                          DATE:  12/10/2023  Therapeutic Activities: 4 box step up/down w/ Rt LE leading. Patient had difficulty with stepping down due to decreased eccentric control/strength. 2x10 TKE, 3x10 with green resistance band. Cues to slowly straighten the knee instead of snapping back.  Neuro Re-Ed SPT verbally reviewed and demo grapevine exercise that is in HEP. Patient still had knee pain with the exercise. Discontinued this exercise for that reason Star cone taps: 10 rounds each leg. Patient had difficulty controlled knee flexion during stance time due to decreased eccentric control. BUE support //bars.    PATIENT EDUCATION:  Education details: POC Person educated: Patient Education method: Explanation, Verbal cues, handout Education comprehension: verbalized understanding  HOME EXERCISE PROGRAM: Access Code: [email or web address] URL: [email or web address] Date: 12/10/2023 Prepared by: Ismael Theophilus Stallion  Exercises - Gastroc Stretch on Step  - 1-2 x daily - 7 x weekly - 1 sets - 3 reps - 30 seconds hold - Seated Hamstring Stretch with Strap  - 1-2 x daily - 7 x weekly - 1 sets - 3 reps - 30 seconds hold - wide stance head motions eyes open  - 1 x daily - 4 x weekly - 1 sets - 5-10 reps - 2 seconds hold - Standing Balance with Eyes Closed  - 1 x daily - 4 x weekly - 1 sets - 3 reps - 10 seconds hold - Feet Apart with Eyes Closed with Head Motions  - 1 x daily - 4 x weekly - 1 sets - 5-10 reps - 2 seconds hold - Wide stance on Foam Pad head movements  - 1 x daily - 4 x weekly - 1 sets - 5-10 reps - 2 seconds  hold - Squat with Chair and Counter Support  - 1 x daily - 4-7 x weekly - 2 sets - 10 reps - 5 seconds hold - sit to stand and stand to sit  - 1 x daily - 4-7 x weekly - 2 sets - 10 reps - 5 seconds hold - Carioca with Counter Support  - 1 x daily - 4-7 x weekly - 2 sets - 10 reps - Standing Terminal Knee Extension with Resistance  - 1 x daily - 7 x weekly - 2-3 sets - 10 reps - 3-5 seconds hold  ASSESSMENT: CLINICAL IMPRESSION: Patient overall did well today.  We reviewed stretches due to increased soreness caused by an increase in activity around the house.  Patient responded well to strengthening exercises introduced today.  Initially the patient had some pain but after a couple of repetitions the pain decreased.  It seems like patient is starting to understand how strengthening could help his knee pain.  Patient will continue to benefit from skilled PT to address impairments and minimize falls.  OBJECTIVE IMPAIRMENTS: Abnormal gait, cardiopulmonary status limiting activity, decreased balance, decreased endurance, decreased knowledge of use of DME, decreased mobility, decreased ROM, decreased strength, impaired flexibility, impaired sensation, postural dysfunction, and pain.   ACTIVITY LIMITATIONS: carrying, lifting, standing, stairs, transfers, and locomotion level  PARTICIPATION LIMITATIONS: meal prep, cleaning, laundry, community activity, and yard work  PERSONAL FACTORS: Age, Fitness, Past/current experiences, Time since onset of injury/illness/exacerbation, and 3+ comorbidities: see PMH are also affecting patient's functional outcome.   REHAB POTENTIAL: Good  CLINICAL DECISION MAKING: Evolving/moderate complexity  EVALUATION COMPLEXITY: Moderate   GOALS: Goals reviewed with patient? Yes  SHORT TERM GOALS: target date for Short term goals 12/07/23   1.  Patient will demonstrate independent use of home exercise program to maintain progress from in clinic treatments.  Baseline: See  objective data Goal status: MET, 12/08/2023  2. Patient will demonstrate TUG </= 12 seconds without use of UE for turning.  Baseline: See objective data Goal status: NOT MET, 12/08/2023  3. BERG score >/= 25/56 Baseline: See objective data Goal status: MET, 12/08/2023  LONG TERM GOALS: target dates for all long term goals 02/04/24   1. Patient will demonstrate/report pain at worst less than or equal to 2/10 to facilitate minimal limitation in daily activity secondary to pain symptoms. Baseline: See objective data Goal status: ongoing, 11/23/2023   2. Patient will demonstrate independent use of home exercise program to facilitate ability to maintain/progress  functional gains from skilled physical therapy services. Baseline: See objective data Goal status: ongoing, 11/23/2023  3.  BERG score >/= 30/56 for MDIC  Baseline: SEE OBJECTIVE DATA Goal status: ongoing, 11/23/2023   4. Patient will demonstrate TUG </= 10 seconds without use of UE for turning.  Baseline: See objective data Goal status: ongoing, 11/23/2023   5.  Patient will report no falls within the last 4 weeks of PT Baseline: See objective data Goal status:ongoing, 11/23/2023   6.  Patient will ambulate > 300' with appropriate assistive device with modified independence Baseline: See objective data Goal status: ongoing, 11/23/2023   PLAN:  PT FREQUENCY:  2x/week  PT DURATION: 12 weeks  PLANNED INTERVENTIONS: 97164- PT Re-evaluation, 97750- Physical Performance Testing, 97110-Therapeutic exercises, 97530- Therapeutic activity, [id number]- Neuromuscular re-education, 97535- Self Care, [id number]- Gait training, Patient/Family education, Balance training, Stair training, and DME instructions  PLAN FOR NEXT SESSION:  Do 10th visit progress note with objective measure.  eccentric loading of quad, reactive balance exercise, continue education for understanding   Ismael Nap, Student-PT 12/17/2023, 4:31 PM  This entire session  of physical therapy was performed under the direct supervision of PT signing evaluation /treatment. PT reviewed note and agrees.   Grayce Spatz, PT, DPT 12/17/2023, 5:19 PM

## 2023-12-21 ENCOUNTER — Encounter: Payer: Self-pay | Admitting: Physical Therapy

## 2023-12-21 ENCOUNTER — Ambulatory Visit (INDEPENDENT_AMBULATORY_CARE_PROVIDER_SITE_OTHER): Admitting: Physical Therapy

## 2023-12-21 DIAGNOSIS — R2689 Other abnormalities of gait and mobility: Secondary | ICD-10-CM | POA: Diagnosis not present

## 2023-12-21 DIAGNOSIS — R2681 Unsteadiness on feet: Secondary | ICD-10-CM

## 2023-12-21 DIAGNOSIS — R296 Repeated falls: Secondary | ICD-10-CM | POA: Diagnosis not present

## 2023-12-21 DIAGNOSIS — G8929 Other chronic pain: Secondary | ICD-10-CM

## 2023-12-21 DIAGNOSIS — R29898 Other symptoms and signs involving the musculoskeletal system: Secondary | ICD-10-CM

## 2023-12-21 DIAGNOSIS — M25561 Pain in right knee: Secondary | ICD-10-CM

## 2023-12-21 DIAGNOSIS — M6281 Muscle weakness (generalized): Secondary | ICD-10-CM

## 2023-12-21 NOTE — Therapy (Addendum)
 OUTPATIENT PHYSICAL THERAPY LOWER EXTREMITY TREATMENT & PROGRESS NOTE   Patient Name: Gabriel Evans MRN: [id number] DOB:28-Apr-1941, 83 y.o., male Today's Date: 12/21/2023  Progress Note Reporting Period 11/09/2023 to 12/21/2023  See note below for Objective Data and Assessment of Progress/Goals.      END OF SESSION:  PT End of Session - 12/21/23 1147     Visit Number 10    Number of Visits 25    Date for PT Re-Evaluation 02/04/24    Authorization Type MEDICARE AND [employer]    Progress Note Due on Visit 10    PT Start Time 1147    PT Stop Time 1230    PT Time Calculation (min) 43 min    Equipment Utilized During Treatment Gait belt    Activity Tolerance Patient tolerated treatment well    Behavior During Therapy WFL for tasks assessed/performed            Past Medical History:  Diagnosis Date   Allergy    Cataract    Diabetes mellitus without complication (HCC)    Thyroid  disease    Past Surgical History:  Procedure Laterality Date   EYE SURGERY     FRACTURE SURGERY     JOINT REPLACEMENT     VASECTOMY     Patient Active Problem List   Diagnosis Date Noted   Pain in right elbow 05/29/2022   Pain in right knee 05/29/2022   Occlusion and stenosis of bilateral carotid arteries 06/26/2021   Diabetes (HCC) 03/19/2021   Skin lesion 03/19/2021   Callus 02/28/2020   Chronic kidney disease, stage 3a (HCC) 04/07/2019   Insomnia 04/07/2019   Benign prostatic hyperplasia with lower urinary tract symptoms 12/03/2017   Chronic obstructive pulmonary disease (HCC) 04/29/2017   Abnormal gait 02/03/2017   Anxiety disorder 02/03/2017   Benign neoplasm of colon 02/03/2017   Hyperlipidemia 02/03/2017   Polyneuropathy due to type 2 diabetes mellitus (HCC) 02/03/2017   Postoperative hypothyroidism 02/03/2017    PCP: Nichole Senior, MD  REFERRING PROVIDER: Persons, Ronal Dragon, PA   REFERRING DIAG: [phone] (ICD-10-CM) - Chronic pain of right knee   THERAPY DIAG:   Unsteadiness on feet  Repeated falls  Muscle weakness (generalized)  Other abnormalities of gait and mobility  Other symptoms and signs involving the musculoskeletal system  Chronic pain of right knee  Rationale for Evaluation and Treatment: Rehabilitation  ONSET DATE: 10/27/23 PA referral to PT  SUBJECTIVE:   SUBJECTIVE STATEMENT:   Patient reports some overall soreness and tightness after our previous PT session, specifically after the leg press. He reports soreness after household work.  From Eval: Patient on 10/27/2023, reported to PA with chronic R knee and hamstring pain. Patient has history of falls and R sided weakness. He had a neurologic work up that indicated polyneuropathy. Patient reports to PT today after an initial fall estimated 8 weeks ago in which he landed on his R knee. He believes it occurred due to broken toe when stepping out of room. 4 weeks after the initial fall, he fell again on his R knee after his toe got caught when standing up from chair. His most recent fall was last night when he turned off the lights, turned around, fell and scrapped his elbow. When he stands up, he hears noise from his leg and feels unstable initially until he stands and walks. Patient notices his R knee doesn't hold his weight and reports symptoms of buckling.   PERTINENT HISTORY: Occlusion and stenosis of  bilateral carotid arteries, diabetes, polyneuropathy, chronic kidney disease stage 3a, Benign prostatic hyperplasia with lower urinary tract symptoms, COPD, Benign neoplasm of colon, Cataract, Joint replacement  PAIN:  Are you having pain? Yes: NPRS scale: 2/10 when going from sitting to standing  Pain location: hamstring and calf Pain description: tightness and racheting Aggravating factors: initially standing up  Relieving factors: tylenol and taking a few steps   PRECAUTIONS: Fall  RED FLAGS: None   WEIGHT BEARING RESTRICTIONS: No  FALLS:  Has patient fallen in last 6  months? Yes. Number of falls 2-3x a month, injuries include broken toe, scrap on elbow, black eye  LIVING ENVIRONMENT: Lives with: lives with their spouse who has disabilities  Lives in: [place], 2 story with master bedroom and bathroom downstairs  Stairs: Yes: Internal: 8+8 steps; on right going up and External: 4-5 steps; on right going up Has following equipment at home: Single point cane, Environmental consultant - 2 wheeled, Tour manager, and Grab bars  OCCUPATION: retired   PLOF: Independent  He reports repetitive falls   PATIENT GOALS:  Get rid of R knee pain. Improve strength and balance to decrease fall frequency   NEXT MD VISIT:  tbd   OBJECTIVE:  DIAGNOSTIC FINDINGS:  10/27/2023, Radiographs of his R knee show degenerative changes with no acute fractures noted   Patient-Specific Activity Scoring Scheme  0 represents "unable to perform." 10 represents "able to perform at prior level. 0 1 2 3 4 5 6 7 8 9  10 (Date and Score)   Activity Eval     1. ADLs 5    2. Walking 5    3. Prevent falls 5   4.    5.    Score 5    Total score = sum of the activity scores/number of activities Minimum detectable change (90%CI) for average score = 2 points Minimum detectable change (90%CI) for single activity score = 3 points  COGNITION: Overall cognitive status: WFL    SENSATION: Patient states no sensation in his feet due to diabetes  MUSCLE LENGTH Hamstring (hip position at 90* and knee extending) R: - 38* L: -48*  POSTURE: rounded shoulders and forward head  LOWER EXTREMITY ROM:   ROM Right eval Left eval  Hip flexion    Hip extension    Hip abduction    Hip adduction    Hip internal rotation    Hip external rotation    Knee flexion    Knee extension    Ankle dorsiflexion P: - 6 P: - 8  Ankle plantarflexion    Ankle inversion    Ankle eversion     (Blank rows = not tested)  LOWER EXTREMITY MMT:  MMT Right eval Left eval  Hip flexion    Hip extension    Hip  abduction    Hip adduction    Hip internal rotation    Hip external rotation    Knee flexion    Knee extension 5/5 5/5  Ankle dorsiflexion    Ankle plantarflexion    Ankle inversion    Ankle eversion     (Blank rows = not tested)   FUNCTIONAL TESTS:  12/21/2023 TUG: 11.91 seconds   12/08/2023:  Timed Up & Go: 12.13 seconds no AD  Berg Balance 39/56   11/23/2023 TUG: 13.25 sec with CGA Cognitive TUG:14.37 sec with CGA stopped naming during turns Gait Speed (self-selected):  1.29 ft/sec Gait Speed (fast): 1.39 ft/sec  11/09/2023 Berg Balance 21/56  GAIT Gait pattern:  excessive arm swing for counterbalance BUEs, step through pattern, decreased step length- Right, decreased step length- Left, decreased stride length, decreased hip/knee flexion- Right, decreased hip/knee flexion- Left, knee flexed in stance- Right, lateral lean- Left, trunk flexed, and wide BOS Distance walked: 100' x 2 Assistive device utilized: None Level of assistance: SBA / verbal cues needed for gait deviations  TODAY'S TREATMENT                                                                          DATE:  12/21/2023  Therapeutic Exercise:  Recumbent bike seat 8, level 2 for 8 minutes  Hooklying hip flexor stretch, 3x 30 seconds each leg    Therapeutic Activities: Lateral steps w/ 6 step, 3x8. Focused on eccentric control  Fwd step up/down w/ 6 step:  1x8 using both UE with step to pattern 1x8 using both UE with step through pattern 2x8 using fingertip support with step through pattern  TUG performed today  TODAY'S TREATMENT                                                                          DATE:  12/17/2023  Therapeutic Exercise:  Recumbent bike seat 8, level 2 for 8 minutes  Gastroc step stretch, 2x30 seconds each leg  Hamstring stretch, 2x30 seconds each leg  Therapeutic Activities: Double leg press: 100# 1x10 for on bilateral functional strength Double leg concentric to single leg  eccentric, 81#, 1x10 with ea LE - working eccentric functional control.   Single leg press, 50#, 2x10 working on single functional strength Walking backwards and side stepping to right & to left, 2 laps (25')  each. Had minimal LOB which he was able to self-correct   TREATMENT                                                                          DATE:  12/15/2023  Therapeutic Exercise: Recumbent Bike seat 8 level 3 for 8 min.  Patient reported knee pain that increased from 2/10 to 3/10 mainly behind patella.   Therapeutic Activities: 4 box step up 10 reps each LE with BUE support //bars 4 box step down 10 reps each LE with BUE support //bars Side step to right & left 4 box 10 reps with BUE support // bars BLE TKE 2x10 with green resistance band and contralateral LE tapping cone for SLS.   Neuro Re-Ed 4 square stepping over 1 inch PVC pipes with minA for balance losses.  Patient had balance losses stepping backward with either lower extremity leading and by the end he was having difficulty with lateral steps to the right and left.   PATIENT EDUCATION:  Education details: POC Person educated: Patient Education method: Explanation, Verbal cues, handout Education comprehension: verbalized understanding  HOME EXERCISE PROGRAM: Access Code: [email or web address] URL: [email or web address] Date: 12/10/2023 Prepared by: Ismael Theophilus Stallion  Exercises - Gastroc Stretch on Step  - 1-2 x daily - 7 x weekly - 1 sets - 3 reps - 30 seconds hold - Seated Hamstring Stretch with Strap  - 1-2 x daily - 7 x weekly - 1 sets - 3 reps - 30 seconds hold - wide stance head motions eyes open  - 1 x daily - 4 x weekly - 1 sets - 5-10 reps - 2 seconds hold - Standing Balance with Eyes Closed  - 1 x daily - 4 x weekly - 1 sets - 3 reps - 10 seconds hold - Feet Apart with Eyes Closed with Head Motions  - 1 x daily - 4 x weekly - 1 sets - 5-10 reps - 2 seconds hold - Wide stance on Foam Pad  head movements  - 1 x daily - 4 x weekly - 1 sets - 5-10 reps - 2 seconds hold - Squat with Chair and Counter Support  - 1 x daily - 4-7 x weekly - 2 sets - 10 reps - 5 seconds hold - sit to stand and stand to sit  - 1 x daily - 4-7 x weekly - 2 sets - 10 reps - 5 seconds hold - Carioca with Counter Support  - 1 x daily - 4-7 x weekly - 2 sets - 10 reps - Standing Terminal Knee Extension with Resistance  - 1 x daily - 7 x weekly - 2-3 sets - 10 reps - 3-5 seconds hold  ASSESSMENT: CLINICAL IMPRESSION: Patient overall did well today.  Patient demonstrated progression with the TUG by improving his previous time.  This demonstrates increased safety with ambulation and turns.  Patient also demonstrated progression with lateral and forward step up/down by successfully using a 6 box.  Patient had attempted to use the 6 box previously but was unable to due to pain and weakness.  Patient will continue to benefit from skilled PT to address impairments and minimize falls  OBJECTIVE IMPAIRMENTS: Abnormal gait, cardiopulmonary status limiting activity, decreased balance, decreased endurance, decreased knowledge of use of DME, decreased mobility, decreased ROM, decreased strength, impaired flexibility, impaired sensation, postural dysfunction, and pain.   ACTIVITY LIMITATIONS: carrying, lifting, standing, stairs, transfers, and locomotion level  PARTICIPATION LIMITATIONS: meal prep, cleaning, laundry, community activity, and yard work  PERSONAL FACTORS: Age, Fitness, Past/current experiences, Time since onset of injury/illness/exacerbation, and 3+ comorbidities: see PMH are also affecting patient's functional outcome.   REHAB POTENTIAL: Good  CLINICAL DECISION MAKING: Evolving/moderate complexity  EVALUATION COMPLEXITY: Moderate   GOALS: Goals reviewed with patient? Yes  SHORT TERM GOALS: target date for Short term goals 12/07/23   1.  Patient will demonstrate independent use of home exercise  program to maintain progress from in clinic treatments.  Baseline: See objective data Goal status: MET, 12/08/2023  2. Patient will demonstrate TUG </= 12 seconds without use of UE for turning.  Baseline: See objective data Goal status: MET,12/21/2023  3. BERG score >/= 25/56 Baseline: See objective data Goal status: MET, 12/08/2023  LONG TERM GOALS: target dates for all long term goals 02/04/24   1. Patient will demonstrate/report pain at worst less than or equal to 2/10 to facilitate minimal limitation in daily activity secondary to pain symptoms. Baseline: See objective data Goal status: ongoing, 12/21/2023   2.  Patient will demonstrate independent use of home exercise program to facilitate ability to maintain/progress functional gains from skilled physical therapy services. Baseline: See objective data Goal status: ongoing, 12/21/2023  3.  BERG score >/= 30/56 for MDIC  Baseline: SEE OBJECTIVE DATA Goal status: ongoing, 12/21/2023   4. Patient will demonstrate TUG </= 10 seconds without use of UE for turning.  Baseline: See objective data Goal status: ongoing, 12/21/2023   5.  Patient will report no falls within the last 4 weeks of PT Baseline: See objective data Goal status:ongoing, 12/21/2023   6.  Patient will ambulate > 300' with appropriate assistive device with modified independence Baseline: See objective data Goal status: ongoing, 12/21/2023   PLAN:  PT FREQUENCY:  2x/week  PT DURATION: 12 weeks  PLANNED INTERVENTIONS: 97164- PT Re-evaluation, 97750- Physical Performance Testing, 97110-Therapeutic exercises, 97530- Therapeutic activity, 97112- Neuromuscular re-education, [id number]- Self Care, [id number]- Gait training, Patient/Family education, Balance training, Stair training, and DME instructions  PLAN FOR NEXT SESSION:  reactive balance, reaching balance, continue and eventually progress steps   Ismael Nap, Student-PT 12/21/2023, 5:11 PM  This entire session of  physical therapy was performed under the direct supervision of PT signing evaluation /treatment. PT reviewed note and agrees.   Grayce Spatz, PT, DPT 12/21/2023, 8:14 PM

## 2023-12-22 ENCOUNTER — Encounter: Payer: Self-pay | Admitting: Physical Therapy

## 2023-12-22 ENCOUNTER — Ambulatory Visit (INDEPENDENT_AMBULATORY_CARE_PROVIDER_SITE_OTHER): Admitting: Physical Therapy

## 2023-12-22 DIAGNOSIS — M6281 Muscle weakness (generalized): Secondary | ICD-10-CM

## 2023-12-22 DIAGNOSIS — R2681 Unsteadiness on feet: Secondary | ICD-10-CM

## 2023-12-22 DIAGNOSIS — R296 Repeated falls: Secondary | ICD-10-CM

## 2023-12-22 DIAGNOSIS — G8929 Other chronic pain: Secondary | ICD-10-CM

## 2023-12-22 DIAGNOSIS — R29898 Other symptoms and signs involving the musculoskeletal system: Secondary | ICD-10-CM

## 2023-12-22 DIAGNOSIS — R2689 Other abnormalities of gait and mobility: Secondary | ICD-10-CM | POA: Diagnosis not present

## 2023-12-22 DIAGNOSIS — M25561 Pain in right knee: Secondary | ICD-10-CM

## 2023-12-22 NOTE — Therapy (Addendum)
 OUTPATIENT PHYSICAL THERAPY LOWER EXTREMITY TREATMENT   Patient Name: Gabriel Evans MRN: [id number] DOB:[birth year], 83 y.o., male Today's Date: 12/22/2023     END OF SESSION:  PT End of Session - 12/22/23 1144     Visit Number 11    Number of Visits 25    Date for PT Re-Evaluation 02/04/24    Authorization Type MEDICARE AND [employer]    Progress Note Due on Visit 20    PT Start Time 1144    PT Stop Time 1229    PT Time Calculation (min) 45 min    Equipment Utilized During Treatment Gait belt    Activity Tolerance Patient tolerated treatment well    Behavior During Therapy WFL for tasks assessed/performed            Past Medical History:  Diagnosis Date   Allergy    Cataract    Diabetes mellitus without complication (HCC)    Thyroid  disease    Past Surgical History:  Procedure Laterality Date   EYE SURGERY     FRACTURE SURGERY     JOINT REPLACEMENT     VASECTOMY     Patient Active Problem List   Diagnosis Date Noted   Pain in right elbow 05/29/2022   Pain in right knee 05/29/2022   Occlusion and stenosis of bilateral carotid arteries 06/26/2021   Diabetes (HCC) 03/19/2021   Skin lesion 03/19/2021   Callus 02/28/2020   Chronic kidney disease, stage 3a (HCC) 04/07/2019   Insomnia 04/07/2019   Benign prostatic hyperplasia with lower urinary tract symptoms 12/03/2017   Chronic obstructive pulmonary disease (HCC) 04/29/2017   Abnormal gait 02/03/2017   Anxiety disorder 02/03/2017   Benign neoplasm of colon 02/03/2017   Hyperlipidemia 02/03/2017   Polyneuropathy due to type 2 diabetes mellitus (HCC) 02/03/2017   Postoperative hypothyroidism 02/03/2017    PCP: Nichole Senior, MD  REFERRING PROVIDER: Persons, Ronal Dragon, PA   REFERRING DIAG: [phone] (ICD-10-CM) - Chronic pain of right knee   THERAPY DIAG:  Unsteadiness on feet  Repeated falls  Muscle weakness (generalized)  Other abnormalities of gait and mobility  Other symptoms and signs  involving the musculoskeletal system  Chronic pain of right knee  Rationale for Evaluation and Treatment: Rehabilitation  ONSET DATE: 10/27/23 PA referral to PT  SUBJECTIVE:   SUBJECTIVE STATEMENT:  Patient overall doing well and no new changes. Patient reports some soreness from yesterday.   From Eval: Patient on 10/27/2023, reported to PA with chronic R knee and hamstring pain. Patient has history of falls and R sided weakness. He had a neurologic work up that indicated polyneuropathy. Patient reports to PT today after an initial fall estimated 8 weeks ago in which he landed on his R knee. He believes it occurred due to broken toe when stepping out of room. 4 weeks after the initial fall, he fell again on his R knee after his toe got caught when standing up from chair. His most recent fall was last night when he turned off the lights, turned around, fell and scrapped his elbow. When he stands up, he hears noise from his leg and feels unstable initially until he stands and walks. Patient notices his R knee doesn't hold his weight and reports symptoms of buckling.   PERTINENT HISTORY: Occlusion and stenosis of bilateral carotid arteries, diabetes, polyneuropathy, chronic kidney disease stage 3a, Benign prostatic hyperplasia with lower urinary tract symptoms, COPD, Benign neoplasm of colon, Cataract, Joint replacement  PAIN:  Are you having  pain? Yes: NPRS scale: 2/10 when going from sitting to standing  Pain location: hamstring and calf Pain description: tightness and racheting Aggravating factors: initially standing up  Relieving factors: tylenol and taking a few steps   PRECAUTIONS: Fall  RED FLAGS: None   WEIGHT BEARING RESTRICTIONS: No  FALLS:  Has patient fallen in last 6 months? Yes. Number of falls 2-3x a month, injuries include broken toe, scrap on elbow, black eye  LIVING ENVIRONMENT: Lives with: lives with their spouse who has disabilities  Lives in: [place], 2 story with  master bedroom and bathroom downstairs  Stairs: Yes: Internal: 8+8 steps; on right going up and External: 4-5 steps; on right going up Has following equipment at home: Single point cane, Environmental consultant - 2 wheeled, Tour manager, and Grab bars  OCCUPATION: retired   PLOF: Independent  He reports repetitive falls   PATIENT GOALS:  Get rid of R knee pain. Improve strength and balance to decrease fall frequency   NEXT MD VISIT:  tbd   OBJECTIVE:  DIAGNOSTIC FINDINGS:  10/27/2023, Radiographs of his R knee show degenerative changes with no acute fractures noted   Patient-Specific Activity Scoring Scheme  0 represents "unable to perform." 10 represents "able to perform at prior level. 0 1 2 3 4 5 6 7 8 9  10 (Date and Score)   Activity Eval     1. ADLs 5    2. Walking 5    3. Prevent falls 5   4.    5.    Score 5    Total score = sum of the activity scores/number of activities Minimum detectable change (90%CI) for average score = 2 points Minimum detectable change (90%CI) for single activity score = 3 points  COGNITION: Overall cognitive status: WFL    SENSATION: Patient states no sensation in his feet due to diabetes  MUSCLE LENGTH Hamstring (hip position at 90* and knee extending) R: - 38* L: -48*  POSTURE: rounded shoulders and forward head  LOWER EXTREMITY ROM:   ROM Right eval Left eval  Hip flexion    Hip extension    Hip abduction    Hip adduction    Hip internal rotation    Hip external rotation    Knee flexion    Knee extension    Ankle dorsiflexion P: - 6 P: - 8  Ankle plantarflexion    Ankle inversion    Ankle eversion     (Blank rows = not tested)  LOWER EXTREMITY MMT:  MMT Right eval Left eval  Hip flexion    Hip extension    Hip abduction    Hip adduction    Hip internal rotation    Hip external rotation    Knee flexion    Knee extension 5/5 5/5  Ankle dorsiflexion    Ankle plantarflexion    Ankle inversion    Ankle eversion      (Blank rows = not tested)   FUNCTIONAL TESTS:  12/21/2023 TUG: 11.91 seconds   12/08/2023:  Timed Up & Go: 12.13 seconds no AD  Berg Balance 39/56   11/23/2023 TUG: 13.25 sec with CGA Cognitive TUG:14.37 sec with CGA stopped naming during turns Gait Speed (self-selected):  1.29 ft/sec Gait Speed (fast): 1.39 ft/sec  11/09/2023 Berg Balance 21/56  GAIT Gait pattern: excessive arm swing for counterbalance BUEs, step through pattern, decreased step length- Right, decreased step length- Left, decreased stride length, decreased hip/knee flexion- Right, decreased hip/knee flexion- Left, knee flexed in stance-  Right, lateral lean- Left, trunk flexed, and wide BOS Distance walked: 100' x 2 Assistive device utilized: None Level of assistance: SBA / verbal cues needed for gait deviations  TODAY'S TREATMENT                                                                          DATE:  12/22/2023  Therapeutic Exercise:  Recumbent bike seat 8, level 2 for 8 minutes   TherAct: Double leg press: 112#, 2x15 to improve sit to /from stand fluency Single leg press: 56#, 2x15. Verbal and tactile cues to keep LEs out of ER and to control straightening to avoid snapping. Working on single leg stance knee stability.  Double leg press in supine: 112#, 2x15.  Working on sit to/from stand with hip in more functional position.  Single leg press in supine: 56#, 1x15  working on knee stability in single leg stance  Neuro Re-Ed Blaze pod on window reaching activity:  working on balance reactions & functional reach on various surfaces On floor reaching w/ single arm and CGA, 2x 45 seconds each arm On foam reaching w/ single arm and CGA, 2x 45 seconds each arm On foam reaching w/ single arm and supervision, 2x 45 seconds each arm. Patient had bwd LOB when looking reaching for the highest blaze pod    TREATMENT                                                                          DATE:  12/21/2023   Therapeutic Exercise:  Recumbent bike seat 8, level 2 for 8 minutes  Hooklying hip flexor stretch, 3x 30 seconds each leg   Therapeutic Activities: Lateral steps w/ 6 step, 3x8. Focused on eccentric control  Fwd step up/down w/ 6 step:  1x8 using both UE with step to pattern 1x8 using both UE with step through pattern 2x8 using fingertip support with step through pattern  TUG performed today   TREATMENT                                                                          DATE:  12/17/2023  Therapeutic Exercise:  Recumbent bike seat 8, level 2 for 8 minutes  Gastroc step stretch, 2x30 seconds each leg  Hamstring stretch, 2x30 seconds each leg  Therapeutic Activities: Double leg press: 100# 1x10 for on bilateral functional strength Double leg concentric to single leg eccentric, 81#, 1x10 with ea LE - working eccentric functional control.   Single leg press, 50#, 2x10 working on single functional strength Walking backwards and side stepping to right & to left, 2 laps (25')  each. Had minimal LOB which he was able to self-correct  PATIENT EDUCATION:  Education details: POC Person educated: Patient Education method: Explanation, Verbal cues, handout Education comprehension: verbalized understanding  HOME EXERCISE PROGRAM: Access Code: [email or web address] URL: [email or web address] Date: 12/10/2023 Prepared by: Ismael Theophilus Stallion  Exercises - Gastroc Stretch on Step  - 1-2 x daily - 7 x weekly - 1 sets - 3 reps - 30 seconds hold - Seated Hamstring Stretch with Strap  - 1-2 x daily - 7 x weekly - 1 sets - 3 reps - 30 seconds hold - wide stance head motions eyes open  - 1 x daily - 4 x weekly - 1 sets - 5-10 reps - 2 seconds hold - Standing Balance with Eyes Closed  - 1 x daily - 4 x weekly - 1 sets - 3 reps - 10 seconds hold - Feet Apart with Eyes Closed with Head Motions  - 1 x daily - 4 x weekly - 1 sets - 5-10 reps - 2 seconds hold - Wide stance on Foam  Pad head movements  - 1 x daily - 4 x weekly - 1 sets - 5-10 reps - 2 seconds hold - Squat with Chair and Counter Support  - 1 x daily - 4-7 x weekly - 2 sets - 10 reps - 5 seconds hold - sit to stand and stand to sit  - 1 x daily - 4-7 x weekly - 2 sets - 10 reps - 5 seconds hold - Carioca with Counter Support  - 1 x daily - 4-7 x weekly - 2 sets - 10 reps - Standing Terminal Knee Extension with Resistance  - 1 x daily - 7 x weekly - 2-3 sets - 10 reps - 3-5 seconds hold  ASSESSMENT: CLINICAL IMPRESSION: Patient overall did well today. Patient demonstrated good static balance during reaching activity. More work can be done with overhead reaching due to LOB. Patient is progressing with strength and eccentric control as seen with leg press activity. Patient will continue to benefit from skilled PT to address impairments and minimize falls.  OBJECTIVE IMPAIRMENTS: Abnormal gait, cardiopulmonary status limiting activity, decreased balance, decreased endurance, decreased knowledge of use of DME, decreased mobility, decreased ROM, decreased strength, impaired flexibility, impaired sensation, postural dysfunction, and pain.   ACTIVITY LIMITATIONS: carrying, lifting, standing, stairs, transfers, and locomotion level  PARTICIPATION LIMITATIONS: meal prep, cleaning, laundry, community activity, and yard work  PERSONAL FACTORS: Age, Fitness, Past/current experiences, Time since onset of injury/illness/exacerbation, and 3+ comorbidities: see PMH are also affecting patient's functional outcome.   REHAB POTENTIAL: Good  CLINICAL DECISION MAKING: Evolving/moderate complexity  EVALUATION COMPLEXITY: Moderate   GOALS: Goals reviewed with patient? Yes  SHORT TERM GOALS: target date for Short term goals 12/07/23   1.  Patient will demonstrate independent use of home exercise program to maintain progress from in clinic treatments.  Baseline: See objective data Goal status: MET, 12/08/2023  2. Patient  will demonstrate TUG </= 12 seconds without use of UE for turning.  Baseline: See objective data Goal status: MET,12/21/2023  3. BERG score >/= 25/56 Baseline: See objective data Goal status: MET, 12/08/2023  LONG TERM GOALS: target dates for all long term goals 02/04/24   1. Patient will demonstrate/report pain at worst less than or equal to 2/10 to facilitate minimal limitation in daily activity secondary to pain symptoms. Baseline: See objective data Goal status: ongoing, 12/21/2023   2. Patient will demonstrate independent use of home exercise program to facilitate ability to maintain/progress functional gains from skilled physical therapy services. Baseline:  See objective data Goal status: ongoing, 12/21/2023  3.  BERG score >/= 30/56 for MDIC  Baseline: SEE OBJECTIVE DATA Goal status: ongoing, 12/21/2023   4. Patient will demonstrate TUG </= 10 seconds without use of UE for turning.  Baseline: See objective data Goal status: ongoing, 12/21/2023   5.  Patient will report no falls within the last 4 weeks of PT Baseline: See objective data Goal status:ongoing, 12/21/2023   6.  Patient will ambulate > 300' with appropriate assistive device with modified independence Baseline: See objective data Goal status: ongoing, 12/21/2023   PLAN:  PT FREQUENCY:  2x/week  PT DURATION: 12 weeks  PLANNED INTERVENTIONS: 97164- PT Re-evaluation, 97750- Physical Performance Testing, 97110-Therapeutic exercises, 97530- Therapeutic activity, [id number]- Neuromuscular re-education, 97535- Self Care, [id number]- Gait training, Patient/Family education, Balance training, Stair training, and DME instructions  PLAN FOR NEXT SESSION:  reactive balance, step progression, BERG components.  Continue with functional strengthening with leg press   Ismael Nap, Student-PT 12/22/2023, 4:08 PM  This entire session of physical therapy was performed under the direct supervision of PT signing evaluation /treatment.  PT reviewed note and agrees.   Robin Waldron, PT, DPT 12/22/2023, 5:25 PM

## 2023-12-29 ENCOUNTER — Encounter: Payer: Self-pay | Admitting: Physical Therapy

## 2023-12-29 ENCOUNTER — Ambulatory Visit (INDEPENDENT_AMBULATORY_CARE_PROVIDER_SITE_OTHER): Admitting: Physical Therapy

## 2023-12-29 DIAGNOSIS — R296 Repeated falls: Secondary | ICD-10-CM

## 2023-12-29 DIAGNOSIS — R2689 Other abnormalities of gait and mobility: Secondary | ICD-10-CM

## 2023-12-29 DIAGNOSIS — M6281 Muscle weakness (generalized): Secondary | ICD-10-CM | POA: Diagnosis not present

## 2023-12-29 DIAGNOSIS — R29898 Other symptoms and signs involving the musculoskeletal system: Secondary | ICD-10-CM

## 2023-12-29 DIAGNOSIS — M25561 Pain in right knee: Secondary | ICD-10-CM

## 2023-12-29 DIAGNOSIS — G8929 Other chronic pain: Secondary | ICD-10-CM

## 2023-12-29 DIAGNOSIS — R2681 Unsteadiness on feet: Secondary | ICD-10-CM | POA: Diagnosis not present

## 2023-12-29 NOTE — Therapy (Addendum)
 OUTPATIENT PHYSICAL THERAPY LOWER EXTREMITY TREATMENT   Patient Name: Gabriel Evans MRN: [id number] DOB:04-15-1941, 83 y.o., male Today's Date: 12/29/2023     END OF SESSION:  PT End of Session - 12/29/23 1145     Visit Number 12    Number of Visits 25    Date for PT Re-Evaluation 02/04/24    Authorization Type MEDICARE AND [employer]    Progress Note Due on Visit 20    PT Start Time 1145    PT Stop Time 1231    PT Time Calculation (min) 46 min    Equipment Utilized During Treatment Gait belt    Activity Tolerance Patient tolerated treatment well    Behavior During Therapy WFL for tasks assessed/performed            Past Medical History:  Diagnosis Date   Allergy    Cataract    Diabetes mellitus without complication (HCC)    Thyroid  disease    Past Surgical History:  Procedure Laterality Date   EYE SURGERY     FRACTURE SURGERY     JOINT REPLACEMENT     VASECTOMY     Patient Active Problem List   Diagnosis Date Noted   Pain in right elbow 05/29/2022   Pain in right knee 05/29/2022   Occlusion and stenosis of bilateral carotid arteries 06/26/2021   Diabetes (HCC) 03/19/2021   Skin lesion 03/19/2021   Callus 02/28/2020   Chronic kidney disease, stage 3a (HCC) 04/07/2019   Insomnia 04/07/2019   Benign prostatic hyperplasia with lower urinary tract symptoms 12/03/2017   Chronic obstructive pulmonary disease (HCC) 04/29/2017   Abnormal gait 02/03/2017   Anxiety disorder 02/03/2017   Benign neoplasm of colon 02/03/2017   Hyperlipidemia 02/03/2017   Polyneuropathy due to type 2 diabetes mellitus (HCC) 02/03/2017   Postoperative hypothyroidism 02/03/2017    PCP: Nichole Senior, MD  REFERRING PROVIDER: Persons, Ronal Dragon, PA   REFERRING DIAG: [phone] (ICD-10-CM) - Chronic pain of right knee   THERAPY DIAG:  Unsteadiness on feet  Repeated falls  Muscle weakness (generalized)  Other abnormalities of gait and mobility  Other symptoms and signs  involving the musculoskeletal system  Chronic pain of right knee  Rationale for Evaluation and Treatment: Rehabilitation  ONSET DATE: 10/27/23 PA referral to PT  SUBJECTIVE:   SUBJECTIVE STATEMENT:  Patient doing well overall. Patient still reports tightness in anterior portion of the knee and reports stretches do help in the moment.    From Eval: Patient on 10/27/2023, reported to PA with chronic R knee and hamstring pain. Patient has history of falls and R sided weakness. He had a neurologic work up that indicated polyneuropathy. Patient reports to PT today after an initial fall estimated 8 weeks ago in which he landed on his R knee. He believes it occurred due to broken toe when stepping out of room. 4 weeks after the initial fall, he fell again on his R knee after his toe got caught when standing up from chair. His most recent fall was last night when he turned off the lights, turned around, fell and scrapped his elbow. When he stands up, he hears noise from his leg and feels unstable initially until he stands and walks. Patient notices his R knee doesn't hold his weight and reports symptoms of buckling.   PERTINENT HISTORY: Occlusion and stenosis of bilateral carotid arteries, diabetes, polyneuropathy, chronic kidney disease stage 3a, Benign prostatic hyperplasia with lower urinary tract symptoms, COPD, Benign neoplasm of colon,  Cataract, Joint replacement  PAIN:  Are you having pain? Yes: NPRS scale: 2/10 when going from sitting to standing  Pain location: hamstring and calf Pain description: tightness and racheting Aggravating factors: initially standing up  Relieving factors: tylenol and taking a few steps   PRECAUTIONS: Fall  RED FLAGS: None   WEIGHT BEARING RESTRICTIONS: No  FALLS:  Has patient fallen in last 6 months? Yes. Number of falls 2-3x a month, injuries include broken toe, scrap on elbow, black eye  LIVING ENVIRONMENT: Lives with: lives with their spouse who has  disabilities  Lives in: [place], 2 story with master bedroom and bathroom downstairs  Stairs: Yes: Internal: 8+8 steps; on right going up and External: 4-5 steps; on right going up Has following equipment at home: Single point cane, Environmental consultant - 2 wheeled, Tour manager, and Grab bars  OCCUPATION: retired   PLOF: Independent  He reports repetitive falls   PATIENT GOALS:  Get rid of R knee pain. Improve strength and balance to decrease fall frequency   NEXT MD VISIT:  tbd   OBJECTIVE:  DIAGNOSTIC FINDINGS:  10/27/2023, Radiographs of his R knee show degenerative changes with no acute fractures noted   Patient-Specific Activity Scoring Scheme  0 represents "unable to perform." 10 represents "able to perform at prior level. 0 1 2 3 4 5 6 7 8 9  10 (Date and Score)   Activity Eval  11/09/23    1. ADLs 5    2. Walking 5    3. Prevent falls 5   4.    5.    Score 5    Total score = sum of the activity scores/number of activities Minimum detectable change (90%CI) for average score = 2 points Minimum detectable change (90%CI) for single activity score = 3 points  COGNITION: Overall cognitive status: WFL    SENSATION: Patient states no sensation in his feet due to diabetes  MUSCLE LENGTH Hamstring (hip position at 90* and knee extending) R: - 38* L: -48*  POSTURE: rounded shoulders and forward head  LOWER EXTREMITY ROM:   ROM Right eval Left eval  Hip flexion    Hip extension    Hip abduction    Hip adduction    Hip internal rotation    Hip external rotation    Knee flexion    Knee extension    Ankle dorsiflexion P: - 6 P: - 8  Ankle plantarflexion    Ankle inversion    Ankle eversion     (Blank rows = not tested)  LOWER EXTREMITY MMT:  MMT Right eval Left eval  Hip flexion    Hip extension    Hip abduction    Hip adduction    Hip internal rotation    Hip external rotation    Knee flexion    Knee extension 5/5 5/5  Ankle dorsiflexion    Ankle  plantarflexion    Ankle inversion    Ankle eversion     (Blank rows = not tested)   FUNCTIONAL TESTS:  12/21/2023 TUG: 11.91 seconds   12/08/2023:  Timed Up & Go: 12.13 seconds no AD  Berg Balance 39/56   11/23/2023 TUG: 13.25 sec with CGA Cognitive TUG:14.37 sec with CGA stopped naming during turns Gait Speed (self-selected):  1.29 ft/sec Gait Speed (fast): 1.39 ft/sec  11/09/2023 Berg Balance 21/56  GAIT Gait pattern: excessive arm swing for counterbalance BUEs, step through pattern, decreased step length- Right, decreased step length- Left, decreased stride length, decreased hip/knee  flexion- Right, decreased hip/knee flexion- Left, knee flexed in stance- Right, lateral lean- Left, trunk flexed, and wide BOS Distance walked: 100' x 2 Assistive device utilized: None Level of assistance: SBA / verbal cues needed for gait deviations  TODAY'S TREATMENT                                                                          DATE:  12/29/2023  Therapeutic Exercise:  Recumbent bike seat 8, level 2 for 8 minutes working on knee range and fluency of repetitive motion Standing quad stretch with towel. Discontinued due to patient reporting not feeling a stretch and balance concerns. Prone quad stretch with strap, 3x30 seconds each leg  TherAct:  Double leg press: 118#, 2x10 for facilitation of STS Single leg press: 62#, 2x10  for facilitation of STS   TREATMENT                                                                          DATE:  12/22/2023  Therapeutic Exercise:  Recumbent bike seat 8, level 2 for 8 minutes   TherAct: Double leg press: 112#, 2x15 to improve sit to /from stand fluency Single leg press: 56#, 2x15. Verbal and tactile cues to keep LEs out of ER and to control straightening to avoid snapping. Working on single leg stance knee stability.  Double leg press in supine: 112#, 2x15.  Working on sit to/from stand with hip in more functional position.  Single leg  press in supine: 56#, 1x15  working on knee stability in single leg stance  Neuro Re-Ed Blaze pod on window reaching activity:  working on balance reactions & functional reach on various surfaces On floor reaching w/ single arm and CGA, 2x 45 seconds each arm On foam reaching w/ single arm and CGA, 2x 45 seconds each arm On foam reaching w/ single arm and supervision, 2x 45 seconds each arm. Patient had bwd LOB when looking reaching for the highest blaze pod  TREATMENT                                                                          DATE:  12/21/2023  Therapeutic Exercise:  Recumbent bike seat 8, level 2 for 8 minutes  Hooklying hip flexor stretch, 3x 30 seconds each leg   Therapeutic Activities: Lateral steps w/ 6 step, 3x8. Focused on eccentric control  Fwd step up/down w/ 6 step:  1x8 using both UE with step to pattern 1x8 using both UE with step through pattern 2x8 using fingertip support with step through pattern  TUG performed today   PATIENT EDUCATION:  Education details: POC Person educated: Patient Education method: Explanation, Verbal cues,  handout Education comprehension: verbalized understanding  HOME EXERCISE PROGRAM: Access Code: [email or web address] URL: [email or web address] Date: 12/10/2023 Prepared by: Ismael Theophilus Stallion  Exercises - Gastroc Stretch on Step  - 1-2 x daily - 7 x weekly - 1 sets - 3 reps - 30 seconds hold - Seated Hamstring Stretch with Strap  - 1-2 x daily - 7 x weekly - 1 sets - 3 reps - 30 seconds hold - wide stance head motions eyes open  - 1 x daily - 4 x weekly - 1 sets - 5-10 reps - 2 seconds hold - Standing Balance with Eyes Closed  - 1 x daily - 4 x weekly - 1 sets - 3 reps - 10 seconds hold - Feet Apart with Eyes Closed with Head Motions  - 1 x daily - 4 x weekly - 1 sets - 5-10 reps - 2 seconds hold - Wide stance on Foam Pad head movements  - 1 x daily - 4 x weekly - 1 sets - 5-10 reps - 2 seconds hold - Squat  with Chair and Counter Support  - 1 x daily - 4-7 x weekly - 2 sets - 10 reps - 5 seconds hold - sit to stand and stand to sit  - 1 x daily - 4-7 x weekly - 2 sets - 10 reps - 5 seconds hold - Carioca with Counter Support  - 1 x daily - 4-7 x weekly - 2 sets - 10 reps - Standing Terminal Knee Extension with Resistance  - 1 x daily - 7 x weekly - 2-3 sets - 10 reps - 3-5 seconds hold  ASSESSMENT: CLINICAL IMPRESSION: Patient overall did well today.  Patient reported difficulty with STS that is on his HEP program. The leg press was used to assist with his ability to complete STS and help him be successful with functional activities such as getting up from chair in a controlled manner. More stretching will be done to help get rid of tightness feeling. Patient will continue to benefit from skilled PT to address impairments and minimize falls.  OBJECTIVE IMPAIRMENTS: Abnormal gait, cardiopulmonary status limiting activity, decreased balance, decreased endurance, decreased knowledge of use of DME, decreased mobility, decreased ROM, decreased strength, impaired flexibility, impaired sensation, postural dysfunction, and pain.   ACTIVITY LIMITATIONS: carrying, lifting, standing, stairs, transfers, and locomotion level  PARTICIPATION LIMITATIONS: meal prep, cleaning, laundry, community activity, and yard work  PERSONAL FACTORS: Age, Fitness, Past/current experiences, Time since onset of injury/illness/exacerbation, and 3+ comorbidities: see PMH are also affecting patient's functional outcome.   REHAB POTENTIAL: Good  CLINICAL DECISION MAKING: Evolving/moderate complexity  EVALUATION COMPLEXITY: Moderate   GOALS: Goals reviewed with patient? Yes  SHORT TERM GOALS: target date for Short term goals 12/07/23   1.  Patient will demonstrate independent use of home exercise program to maintain progress from in clinic treatments.  Baseline: See objective data Goal status: MET, 12/08/2023  2. Patient will  demonstrate TUG </= 12 seconds without use of UE for turning.  Baseline: See objective data Goal status: MET,12/21/2023  3. BERG score >/= 25/56 Baseline: See objective data Goal status: MET, 12/08/2023  LONG TERM GOALS: target dates for all long term goals 02/04/24   1. Patient will demonstrate/report pain at worst less than or equal to 2/10 to facilitate minimal limitation in daily activity secondary to pain symptoms. Baseline: See objective data Goal status: ongoing, 12/21/2023   2. Patient will demonstrate independent use of home exercise program to facilitate ability to maintain/progress  functional gains from skilled physical therapy services. Baseline: See objective data Goal status: ongoing, 12/21/2023  3.  BERG score >/= 30/56 for MDIC  Baseline: SEE OBJECTIVE DATA Goal status: ongoing, 12/21/2023   4. Patient will demonstrate TUG </= 10 seconds without use of UE for turning.  Baseline: See objective data Goal status: ongoing, 12/21/2023   5.  Patient will report no falls within the last 4 weeks of PT Baseline: See objective data Goal status:ongoing, 12/21/2023   6.  Patient will ambulate > 300' with appropriate assistive device with modified independence Baseline: See objective data Goal status: ongoing, 12/21/2023   PLAN:  PT FREQUENCY:  2x/week  PT DURATION: 12 weeks  PLANNED INTERVENTIONS: 97164- PT Re-evaluation, 97750- Physical Performance Testing, 97110-Therapeutic exercises, 97530- Therapeutic activity, 97112- Neuromuscular re-education, [phone]- Self Care, [id number]- Gait training, Patient/Family education, Balance training, Stair training, and DME instructions  PLAN FOR NEXT SESSION: do updated PSFS,  reactive balance, reaching balance, step progression, functional strengthening    Ismael Nap, Student-PT 12/29/2023, 3:14 PM  This entire session of physical therapy was performed under the direct supervision of PT signing evaluation /treatment. PT reviewed  note and agrees.   Grayce Spatz, PT, DPT 12/29/2023, 4:48 PM

## 2023-12-30 ENCOUNTER — Encounter: Payer: Self-pay | Admitting: Physical Therapy

## 2023-12-30 ENCOUNTER — Ambulatory Visit (INDEPENDENT_AMBULATORY_CARE_PROVIDER_SITE_OTHER): Admitting: Physical Therapy

## 2023-12-30 DIAGNOSIS — R2689 Other abnormalities of gait and mobility: Secondary | ICD-10-CM

## 2023-12-30 DIAGNOSIS — M6281 Muscle weakness (generalized): Secondary | ICD-10-CM | POA: Diagnosis not present

## 2023-12-30 DIAGNOSIS — G8929 Other chronic pain: Secondary | ICD-10-CM

## 2023-12-30 DIAGNOSIS — R2681 Unsteadiness on feet: Secondary | ICD-10-CM

## 2023-12-30 DIAGNOSIS — R296 Repeated falls: Secondary | ICD-10-CM

## 2023-12-30 DIAGNOSIS — M25561 Pain in right knee: Secondary | ICD-10-CM

## 2023-12-30 DIAGNOSIS — R29898 Other symptoms and signs involving the musculoskeletal system: Secondary | ICD-10-CM

## 2023-12-30 NOTE — Therapy (Addendum)
 OUTPATIENT PHYSICAL THERAPY LOWER EXTREMITY TREATMENT   Patient Name: Gabriel Evans MRN: [id number] DOB:24-Jul-1940, 83 y.o., male Today's Date: 12/30/2023     END OF SESSION:  PT End of Session - 12/30/23 1152     Visit Number 13    Number of Visits 25    Date for PT Re-Evaluation 02/04/24    Authorization Type MEDICARE AND [employer]    Progress Note Due on Visit 20    PT Start Time 1146    PT Stop Time 1233    PT Time Calculation (min) 47 min    Equipment Utilized During Treatment Gait belt    Activity Tolerance Patient tolerated treatment well    Behavior During Therapy WFL for tasks assessed/performed             Past Medical History:  Diagnosis Date   Allergy    Cataract    Diabetes mellitus without complication (HCC)    Thyroid  disease    Past Surgical History:  Procedure Laterality Date   EYE SURGERY     FRACTURE SURGERY     JOINT REPLACEMENT     VASECTOMY     Patient Active Problem List   Diagnosis Date Noted   Pain in right elbow 05/29/2022   Pain in right knee 05/29/2022   Occlusion and stenosis of bilateral carotid arteries 06/26/2021   Diabetes (HCC) 03/19/2021   Skin lesion 03/19/2021   Callus 02/28/2020   Chronic kidney disease, stage 3a (HCC) 04/07/2019   Insomnia 04/07/2019   Benign prostatic hyperplasia with lower urinary tract symptoms 12/03/2017   Chronic obstructive pulmonary disease (HCC) 04/29/2017   Abnormal gait 02/03/2017   Anxiety disorder 02/03/2017   Benign neoplasm of colon 02/03/2017   Hyperlipidemia 02/03/2017   Polyneuropathy due to type 2 diabetes mellitus (HCC) 02/03/2017   Postoperative hypothyroidism 02/03/2017    PCP: Nichole Senior, MD  REFERRING PROVIDER: Persons, Ronal Dragon, PA   REFERRING DIAG: [phone] (ICD-10-CM) - Chronic pain of right knee   THERAPY DIAG:  Unsteadiness on feet  Repeated falls  Muscle weakness (generalized)  Other abnormalities of gait and mobility  Other symptoms and signs  involving the musculoskeletal system  Chronic pain of right knee  Rationale for Evaluation and Treatment: Rehabilitation  ONSET DATE: 10/27/23 PA referral to PT  SUBJECTIVE:   SUBJECTIVE STATEMENT: Patient reports some soreness after yesterday's PT session.  Patient reports no overall changes.  From Eval: Patient on 10/27/2023, reported to PA with chronic R knee and hamstring pain. Patient has history of falls and R sided weakness. He had a neurologic work up that indicated polyneuropathy. Patient reports to PT today after an initial fall estimated 8 weeks ago in which he landed on his R knee. He believes it occurred due to broken toe when stepping out of room. 4 weeks after the initial fall, he fell again on his R knee after his toe got caught when standing up from chair. His most recent fall was last night when he turned off the lights, turned around, fell and scrapped his elbow. When he stands up, he hears noise from his leg and feels unstable initially until he stands and walks. Patient notices his R knee doesn't hold his weight and reports symptoms of buckling.   PERTINENT HISTORY: Occlusion and stenosis of bilateral carotid arteries, diabetes, polyneuropathy, chronic kidney disease stage 3a, Benign prostatic hyperplasia with lower urinary tract symptoms, COPD, Benign neoplasm of colon, Cataract, Joint replacement  PAIN:  Are you having pain?  Yes: NPRS scale: 2/10 when going from sitting to standing  Pain location: hamstring and calf Pain description: tightness and racheting Aggravating factors: initially standing up  Relieving factors: tylenol and taking a few steps   PRECAUTIONS: Fall  RED FLAGS: None   WEIGHT BEARING RESTRICTIONS: No  FALLS:  Has patient fallen in last 6 months? Yes. Number of falls 2-3x a month, injuries include broken toe, scrap on elbow, black eye  LIVING ENVIRONMENT: Lives with: lives with their spouse who has disabilities  Lives in: [place], 2 story with  master bedroom and bathroom downstairs  Stairs: Yes: Internal: 8+8 steps; on right going up and External: 4-5 steps; on right going up Has following equipment at home: Single point cane, Environmental consultant - 2 wheeled, Tour manager, and Grab bars  OCCUPATION: retired   PLOF: Independent  He reports repetitive falls   PATIENT GOALS:  Get rid of R knee pain. Improve strength and balance to decrease fall frequency   NEXT MD VISIT:  tbd   OBJECTIVE:  DIAGNOSTIC FINDINGS:  10/27/2023, Radiographs of his R knee show degenerative changes with no acute fractures noted   Patient-Specific Activity Scoring Scheme  0 represents "unable to perform." 10 represents "able to perform at prior level. 0 1 2 3 4 5 6 7 8 9  10 (Date and Score)   Activity Eval  11/09/23 12/30/2023   1. ADLs 5 6   2. Walking 5 6   3. Prevent falls 5 6  4.    5.    Score 5 6   Total score = sum of the activity scores/number of activities Minimum detectable change (90%CI) for average score = 2 points Minimum detectable change (90%CI) for single activity score = 3 points  COGNITION: Overall cognitive status: WFL    SENSATION: Patient states no sensation in his feet due to diabetes  MUSCLE LENGTH Hamstring (hip position at 90* and knee extending) R: - 38* L: -48*  POSTURE: rounded shoulders and forward head  LOWER EXTREMITY ROM:   ROM Right eval Left eval  Hip flexion    Hip extension    Hip abduction    Hip adduction    Hip internal rotation    Hip external rotation    Knee flexion    Knee extension    Ankle dorsiflexion P: - 6 P: - 8  Ankle plantarflexion    Ankle inversion    Ankle eversion     (Blank rows = not tested)  LOWER EXTREMITY MMT:  MMT Right eval Left eval  Hip flexion    Hip extension    Hip abduction    Hip adduction    Hip internal rotation    Hip external rotation    Knee flexion    Knee extension 5/5 5/5  Ankle dorsiflexion    Ankle plantarflexion    Ankle inversion     Ankle eversion     (Blank rows = not tested)   FUNCTIONAL TESTS:  12/21/2023 TUG: 11.91 seconds   12/08/2023:  Timed Up & Go: 12.13 seconds no AD  Berg Balance 39/56   11/23/2023 TUG: 13.25 sec with CGA Cognitive TUG:14.37 sec with CGA stopped naming during turns Gait Speed (self-selected):  1.29 ft/sec Gait Speed (fast): 1.39 ft/sec  11/09/2023 Berg Balance 21/56  GAIT Gait pattern: excessive arm swing for counterbalance BUEs, step through pattern, decreased step length- Right, decreased step length- Left, decreased stride length, decreased hip/knee flexion- Right, decreased hip/knee flexion- Left, knee flexed in stance-  Right, lateral lean- Left, trunk flexed, and wide BOS Distance walked: 100' x 2 Assistive device utilized: None Level of assistance: SBA / verbal cues needed for gait deviations  TODAY'S TREATMENT                                                                          DATE:  12/30/2023  Therapeutic Exercise:  Recumbent bike seat 8, level 3 for 8 minutes working on knee range and fluency of repetitive motion  Neuro Re-Ed Tandem walking on floor Tandem walking on foam including transition firm to/from compliant surfaces with UE regression: BUE, Rt UE only, 2 fingers, no UE. Patient had moderate sway with 2 fingers and no UE. Was not able to successfully do no UE. Required cues to slow down to control balance. Lateral walking on foam including transition firm to/from compliant surfaces with UE regression: BUE, Rt UE only, 2 fingers, no UE. Patient had moderate sway with 2 fingers and no UE.  Patient was successful with 1 or 2 lateral steps with no UE.  Required cues to slow down to control balance.  TherAct PT verbally educated and demoed to patient how to properly weight-bear over LE when performing STS. Patient verbalized and demo understanding STS from 25 inch table with no UE support.  Patient had a difficult time properly weightbearing over LE and controlled  sit .  Patient's first STS is always the most unsteady, usually requiring him to use external support to not fall. 2x10 STS from 22 inch table with no UE support. Patient had a difficult time properly weightbearing over LE and controlled sit.  Patient reported this was a harder task. 3x  Updated PFPS  TODAY'S TREATMENT                                                                          DATE:  12/29/2023  Therapeutic Exercise:  Recumbent bike seat 8, level 2 for 8 minutes working on knee range and fluency of repetitive motion Standing quad stretch with towel. Discontinued due to patient reporting not feeling a stretch and balance concerns. Prone quad stretch with strap, 3x30 seconds each leg  TherAct:  Double leg press: 118#, 2x10 for facilitation of STS Single leg press: 62#, 2x10  for facilitation of STS   TREATMENT                                                                          DATE:  12/22/2023  Therapeutic Exercise:  Recumbent bike seat 8, level 2 for 8 minutes   TherAct: Double leg press: 112#, 2x15 to improve sit to /from stand fluency Single leg press: 56#, 2x15. Verbal and  tactile cues to keep LEs out of ER and to control straightening to avoid snapping. Working on single leg stance knee stability.  Double leg press in supine: 112#, 2x15.  Working on sit to/from stand with hip in more functional position.  Single leg press in supine: 56#, 1x15  working on knee stability in single leg stance  Neuro Re-Ed Blaze pod on window reaching activity:  working on balance reactions & functional reach on various surfaces On floor reaching w/ single arm and CGA, 2x 45 seconds each arm On foam reaching w/ single arm and CGA, 2x 45 seconds each arm On foam reaching w/ single arm and supervision, 2x 45 seconds each arm. Patient had bwd LOB when looking reaching for the highest blaze pod   PATIENT EDUCATION:  Education details: POC Person educated: Patient Education method:  Explanation, Verbal cues, handout Education comprehension: verbalized understanding  HOME EXERCISE PROGRAM: Access Code: [email or web address] URL: [email or web address] Date: 12/10/2023 Prepared by: Ismael Theophilus Stallion  Exercises - Gastroc Stretch on Step  - 1-2 x daily - 7 x weekly - 1 sets - 3 reps - 30 seconds hold - Seated Hamstring Stretch with Strap  - 1-2 x daily - 7 x weekly - 1 sets - 3 reps - 30 seconds hold - wide stance head motions eyes open  - 1 x daily - 4 x weekly - 1 sets - 5-10 reps - 2 seconds hold - Standing Balance with Eyes Closed  - 1 x daily - 4 x weekly - 1 sets - 3 reps - 10 seconds hold - Feet Apart with Eyes Closed with Head Motions  - 1 x daily - 4 x weekly - 1 sets - 5-10 reps - 2 seconds hold - Wide stance on Foam Pad head movements  - 1 x daily - 4 x weekly - 1 sets - 5-10 reps - 2 seconds hold - Squat with Chair and Counter Support  - 1 x daily - 4-7 x weekly - 2 sets - 10 reps - 5 seconds hold - sit to stand and stand to sit  - 1 x daily - 4-7 x weekly - 2 sets - 10 reps - 5 seconds hold - Carioca with Counter Support  - 1 x daily - 4-7 x weekly - 2 sets - 10 reps - Standing Terminal Knee Extension with Resistance  - 1 x daily - 7 x weekly - 2-3 sets - 10 reps - 3-5 seconds hold  ASSESSMENT: CLINICAL IMPRESSION: Patient did well with balance exercises introduced today.  Patient has a difficult time slowing down and regaining his balance once he loses his balance.  Patient has a difficult time with eccentric lowering into a chair.  Patient would benefit from continued glute and quad strengthening to facilitate lowering. Patient will continue to benefit from skilled PT to address impairments and minimize falls.  OBJECTIVE IMPAIRMENTS: Abnormal gait, cardiopulmonary status limiting activity, decreased balance, decreased endurance, decreased knowledge of use of DME, decreased mobility, decreased ROM, decreased strength, impaired flexibility, impaired  sensation, postural dysfunction, and pain.   ACTIVITY LIMITATIONS: carrying, lifting, standing, stairs, transfers, and locomotion level  PARTICIPATION LIMITATIONS: meal prep, cleaning, laundry, community activity, and yard work  PERSONAL FACTORS: Age, Fitness, Past/current experiences, Time since onset of injury/illness/exacerbation, and 3+ comorbidities: see PMH are also affecting patient's functional outcome.   REHAB POTENTIAL: Good  CLINICAL DECISION MAKING: Evolving/moderate complexity  EVALUATION COMPLEXITY: Moderate   GOALS: Goals reviewed with patient? Yes  SHORT TERM GOALS:  target date for Short term goals 12/07/23   1.  Patient will demonstrate independent use of home exercise program to maintain progress from in clinic treatments.  Baseline: See objective data Goal status: MET, 12/08/2023  2. Patient will demonstrate TUG </= 12 seconds without use of UE for turning.  Baseline: See objective data Goal status: MET,12/21/2023  3. BERG score >/= 25/56 Baseline: See objective data Goal status: MET, 12/08/2023  LONG TERM GOALS: target dates for all long term goals 02/04/24   1. Patient will demonstrate/report pain at worst less than or equal to 2/10 to facilitate minimal limitation in daily activity secondary to pain symptoms. Baseline: See objective data Goal status: ongoing, 12/21/2023   2. Patient will demonstrate independent use of home exercise program to facilitate ability to maintain/progress functional gains from skilled physical therapy services. Baseline: See objective data Goal status: ongoing, 12/21/2023  3.  BERG score >/= 30/56 for MDIC  Baseline: SEE OBJECTIVE DATA Goal status: ongoing, 12/21/2023   4. Patient will demonstrate TUG </= 10 seconds without use of UE for turning.  Baseline: See objective data Goal status: ongoing, 12/21/2023   5.  Patient will report no falls within the last 4 weeks of PT Baseline: See objective data Goal status:ongoing,  12/21/2023   6.  Patient will ambulate > 300' with appropriate assistive device with modified independence Baseline: See objective data Goal status: ongoing, 12/21/2023   PLAN:  PT FREQUENCY:  2x/week  PT DURATION: 12 weeks  PLANNED INTERVENTIONS: 97164- PT Re-evaluation, 97750- Physical Performance Testing, 97110-Therapeutic exercises, 97530- Therapeutic activity, 97112- Neuromuscular re-education, [phone]- Self Care, [id number]- Gait training, Patient/Family education, Balance training, Stair training, and DME instructions  PLAN FOR NEXT SESSION:  reactive balance, reaching balance, step progression, functional strengthening    Ismael Nap, Student-PT 12/30/2023, 1:25 PM  This entire session of physical therapy was performed under the direct supervision of PT signing evaluation /treatment. PT reviewed note and agrees.   Robin Waldron, PT, DPT 12/30/2023, 4:23 PM

## 2024-01-05 ENCOUNTER — Ambulatory Visit (INDEPENDENT_AMBULATORY_CARE_PROVIDER_SITE_OTHER): Admitting: Physical Therapy

## 2024-01-05 ENCOUNTER — Encounter: Payer: Self-pay | Admitting: Physical Therapy

## 2024-01-05 DIAGNOSIS — G8929 Other chronic pain: Secondary | ICD-10-CM

## 2024-01-05 DIAGNOSIS — R2689 Other abnormalities of gait and mobility: Secondary | ICD-10-CM | POA: Diagnosis not present

## 2024-01-05 DIAGNOSIS — R296 Repeated falls: Secondary | ICD-10-CM

## 2024-01-05 DIAGNOSIS — R2681 Unsteadiness on feet: Secondary | ICD-10-CM | POA: Diagnosis not present

## 2024-01-05 DIAGNOSIS — M6281 Muscle weakness (generalized): Secondary | ICD-10-CM | POA: Diagnosis not present

## 2024-01-05 DIAGNOSIS — M25561 Pain in right knee: Secondary | ICD-10-CM

## 2024-01-05 DIAGNOSIS — R29898 Other symptoms and signs involving the musculoskeletal system: Secondary | ICD-10-CM

## 2024-01-05 NOTE — Therapy (Signed)
 OUTPATIENT PHYSICAL THERAPY LOWER EXTREMITY TREATMENT   Patient Name: Gabriel Evans MRN: [id number] DOB:[birth year], 83 y.o., male Today's Date: 01/05/2024     END OF SESSION:  PT End of Session - 01/05/24 1143     Visit Number 14    Number of Visits 25    Date for PT Re-Evaluation 02/04/24    Authorization Type MEDICARE AND [employer]    Progress Note Due on Visit 20    PT Start Time 1143    PT Stop Time 1230    PT Time Calculation (min) 47 min    Equipment Utilized During Treatment Gait belt    Activity Tolerance Patient tolerated treatment well    Behavior During Therapy WFL for tasks assessed/performed              Past Medical History:  Diagnosis Date   Allergy    Cataract    Diabetes mellitus without complication (HCC)    Thyroid  disease    Past Surgical History:  Procedure Laterality Date   EYE SURGERY     FRACTURE SURGERY     JOINT REPLACEMENT     VASECTOMY     Patient Active Problem List   Diagnosis Date Noted   Pain in right elbow 05/29/2022   Pain in right knee 05/29/2022   Occlusion and stenosis of bilateral carotid arteries 06/26/2021   Diabetes (HCC) 03/19/2021   Skin lesion 03/19/2021   Callus 02/28/2020   Chronic kidney disease, stage 3a (HCC) 04/07/2019   Insomnia 04/07/2019   Benign prostatic hyperplasia with lower urinary tract symptoms 12/03/2017   Chronic obstructive pulmonary disease (HCC) 04/29/2017   Abnormal gait 02/03/2017   Anxiety disorder 02/03/2017   Benign neoplasm of colon 02/03/2017   Hyperlipidemia 02/03/2017   Polyneuropathy due to type 2 diabetes mellitus (HCC) 02/03/2017   Postoperative hypothyroidism 02/03/2017    PCP: Gabriel Senior, MD  REFERRING PROVIDER: Persons, Gabriel Dragon, PA   REFERRING DIAG: [id number] (ICD-10-CM) - Chronic pain of right knee   THERAPY DIAG:  Unsteadiness on feet  Repeated falls  Muscle weakness (generalized)  Other abnormalities of gait and mobility  Other symptoms and signs  involving the musculoskeletal system  Chronic pain of right knee  Rationale for Evaluation and Treatment: Rehabilitation  ONSET DATE: 10/27/23 PA referral to PT  SUBJECTIVE:   SUBJECTIVE STATEMENT:  after last PT session his right ankle has been bothering him.  He is questioning if bike or leg press.  He feels like his weight his knee gives out when he goes to sit.   From Eval: Patient on 10/27/2023, reported to PA with chronic R knee and hamstring pain. Patient has history of falls and R sided weakness. He had a neurologic work up that indicated polyneuropathy. Patient reports to PT today after an initial fall estimated 8 weeks ago in which he landed on his R knee. He believes it occurred due to broken toe when stepping out of room. 4 weeks after the initial fall, he fell again on his R knee after his toe got caught when standing up from chair. His most recent fall was last night when he turned off the lights, turned around, fell and scrapped his elbow. When he stands up, he hears noise from his leg and feels unstable initially until he stands and walks. Patient notices his R knee doesn't hold his weight and reports symptoms of buckling.   PERTINENT HISTORY: Occlusion and stenosis of bilateral carotid arteries, diabetes, polyneuropathy, chronic kidney disease stage  3a, Benign prostatic hyperplasia with lower urinary tract symptoms, COPD, Benign neoplasm of colon, Cataract, Joint replacement  PAIN:  Are you having pain? Yes: NPRS scale:  was 5/10 now today 2/10 Pain location: hamstring and calf Pain description: tightness and racheting Aggravating factors: initially standing up  Relieving factors: tylenol and taking a few steps   PRECAUTIONS: Fall  RED FLAGS: None   WEIGHT BEARING RESTRICTIONS: No  FALLS:  Has patient fallen in last 6 months? Yes. Number of falls 2-3x a month, injuries include broken toe, scrap on elbow, black eye  LIVING ENVIRONMENT: Lives with: lives with their  spouse who has disabilities  Lives in: [place], 2 story with master bedroom and bathroom downstairs  Stairs: Yes: Internal: 8+8 steps; on right going up and External: 4-5 steps; on right going up Has following equipment at home: Single point cane, Environmental consultant - 2 wheeled, Tour manager, and Grab bars  OCCUPATION: retired   PLOF: Independent  He reports repetitive falls   PATIENT GOALS:  Get rid of R knee pain. Improve strength and balance to decrease fall frequency   NEXT MD VISIT:  tbd   OBJECTIVE:  DIAGNOSTIC FINDINGS:  10/27/2023, Radiographs of his R knee show degenerative changes with no acute fractures noted   Patient-Specific Activity Scoring Scheme  0 represents "unable to perform." 10 represents "able to perform at prior level. 0 1 2 3 4 5 6 7 8 9  10 (Date and Score)   Activity Eval  11/09/23 12/30/2023   1. ADLs 5 6   2. Walking 5 6   3. Prevent falls 5 6  4.    5.    Score 5 6   Total score = sum of the activity scores/number of activities Minimum detectable change (90%CI) for average score = 2 points Minimum detectable change (90%CI) for single activity score = 3 points  COGNITION: Overall cognitive status: WFL    SENSATION: Patient states no sensation in his feet due to diabetes  MUSCLE LENGTH Hamstring (hip position at 90* and knee extending) R: - 38* L: -48*  POSTURE: rounded shoulders and forward head  LOWER EXTREMITY ROM:   ROM Right eval Left eval  Hip flexion    Hip extension    Hip abduction    Hip adduction    Hip internal rotation    Hip external rotation    Knee flexion    Knee extension    Ankle dorsiflexion P: - 6 P: - 8  Ankle plantarflexion    Ankle inversion    Ankle eversion     (Blank rows = not tested)  LOWER EXTREMITY MMT:  MMT Right eval Left eval  Hip flexion    Hip extension    Hip abduction    Hip adduction    Hip internal rotation    Hip external rotation    Knee flexion    Knee extension 5/5 5/5  Ankle  dorsiflexion    Ankle plantarflexion    Ankle inversion    Ankle eversion     (Blank rows = not tested)   FUNCTIONAL TESTS:  12/21/2023 TUG: 11.91 seconds   12/08/2023:  Timed Up & Go: 12.13 seconds no AD  Berg Balance 39/56   11/23/2023 TUG: 13.25 sec with CGA Cognitive TUG:14.37 sec with CGA stopped naming during turns Gait Speed (self-selected):  1.29 ft/sec Gait Speed (fast): 1.39 ft/sec  11/09/2023 Berg Balance 21/56  GAIT Gait pattern: excessive arm swing for counterbalance BUEs, step through pattern, decreased  step length- Right, decreased step length- Left, decreased stride length, decreased hip/knee flexion- Right, decreased hip/knee flexion- Left, knee flexed in stance- Right, lateral lean- Left, trunk flexed, and wide BOS Distance walked: 100' x 2 Assistive device utilized: None Level of assistance: SBA / verbal cues needed for gait deviations  TODAY'S TREATMENT                                                                          DATE:  01/05/2024  Therapeutic Exercise:  Recumbent bike seat 8, level 3 for 8 minutes working on knee range and fluency of repetitive motion.  PT educated on HEP including muscle endurance component.  Seated exercises are less intensity than standing so may be able to increase time better.  Pt verbalized understanding.  PT educated pt on well-rounded program includes flexibility, strength, balance & muscle endurance.  PT recommended flexibility daily.  And the other 3 components 3-5x/wk.  Pt verbalized understanding.  Standing gastroc stretch 30 sec ea LE 3 reps. Seated hamstring stretch with strap 30 sec 3 reps.  Self-Care Patient reports that he sits for ~4 hours in man cave most days.  Then he is very stiff when he tries to get up.  PT recommended setting a timer and standing every 30 min.  He can do standing gastroc stretch, march in place, weight shift, etc.  He verbalized understanding including rationale.      TREATMENT                                                                           DATE:  12/30/2023  Therapeutic Exercise:  Recumbent bike seat 8, level 3 for 8 minutes working on knee range and fluency of repetitive motion  Neuro Re-Ed Tandem walking on floor Tandem walking on foam including transition firm to/from compliant surfaces with UE regression: BUE, Rt UE only, 2 fingers, no UE. Patient had moderate sway with 2 fingers and no UE. Was not able to successfully do no UE. Required cues to slow down to control balance. Lateral walking on foam including transition firm to/from compliant surfaces with UE regression: BUE, Rt UE only, 2 fingers, no UE. Patient had moderate sway with 2 fingers and no UE.  Patient was successful with 1 or 2 lateral steps with no UE.  Required cues to slow down to control balance.  TherAct PT verbally educated and demoed to patient how to properly weight-bear over LE when performing STS. Patient verbalized and demo understanding STS from 25 inch table with no UE support.  Patient had a difficult time properly weightbearing over LE and controlled sit .  Patient's first STS is always the most unsteady, usually requiring him to use external support to not fall. 2x10 STS from 22 inch table with no UE support. Patient had a difficult time properly weightbearing over LE and controlled sit.  Patient reported this was a harder task. 3x  Updated PFPS  TREATMENT                                                                          DATE:  12/29/2023  Therapeutic Exercise:  Recumbent bike seat 8, level 2 for 8 minutes working on knee range and fluency of repetitive motion Standing quad stretch with towel. Discontinued due to patient reporting not feeling a stretch and balance concerns. Prone quad stretch with strap, 3x30 seconds each leg  TherAct:  Double leg press: 118#, 2x10 for facilitation of STS Single leg press: 62#, 2x10  for facilitation of STS    PATIENT EDUCATION:   Education details: POC Person educated: Patient Education method: Explanation, Verbal cues, handout Education comprehension: verbalized understanding  HOME EXERCISE PROGRAM: Access Code: [email or web address] URL: [email or web address] Date: 12/10/2023 Prepared by: Ismael Theophilus Stallion  Exercises - Gastroc Stretch on Step  - 1-2 x daily - 7 x weekly - 1 sets - 3 reps - 30 seconds hold - Seated Hamstring Stretch with Strap  - 1-2 x daily - 7 x weekly - 1 sets - 3 reps - 30 seconds hold - wide stance head motions eyes open  - 1 x daily - 4 x weekly - 1 sets - 5-10 reps - 2 seconds hold - Standing Balance with Eyes Closed  - 1 x daily - 4 x weekly - 1 sets - 3 reps - 10 seconds hold - Feet Apart with Eyes Closed with Head Motions  - 1 x daily - 4 x weekly - 1 sets - 5-10 reps - 2 seconds hold - Wide stance on Foam Pad head movements  - 1 x daily - 4 x weekly - 1 sets - 5-10 reps - 2 seconds hold - Squat with Chair and Counter Support  - 1 x daily - 4-7 x weekly - 2 sets - 10 reps - 5 seconds hold - sit to stand and stand to sit  - 1 x daily - 4-7 x weekly - 2 sets - 10 reps - 5 seconds hold - Carioca with Counter Support  - 1 x daily - 4-7 x weekly - 2 sets - 10 reps - Standing Terminal Knee Extension with Resistance  - 1 x daily - 7 x weekly - 2-3 sets - 10 reps - 3-5 seconds hold  ASSESSMENT: CLINICAL IMPRESSION: Patient appears to understand PT recommendations to decrease his sedentary activities. He seems to pick and choose 1-2 exercises that he thinks he should do and does not do his complete HEP.   Patient will continue to benefit from skilled PT to address impairments and minimize falls.  OBJECTIVE IMPAIRMENTS: Abnormal gait, cardiopulmonary status limiting activity, decreased balance, decreased endurance, decreased knowledge of use of DME, decreased mobility, decreased ROM, decreased strength, impaired flexibility, impaired sensation, postural dysfunction, and pain.    ACTIVITY LIMITATIONS: carrying, lifting, standing, stairs, transfers, and locomotion level  PARTICIPATION LIMITATIONS: meal prep, cleaning, laundry, community activity, and yard work  PERSONAL FACTORS: Age, Fitness, Past/current experiences, Time since onset of injury/illness/exacerbation, and 3+ comorbidities: see PMH are also affecting patient's functional outcome.   REHAB POTENTIAL: Good  CLINICAL DECISION MAKING: Evolving/moderate complexity  EVALUATION COMPLEXITY: Moderate   GOALS: Goals reviewed with patient? Yes  SHORT  TERM GOALS: target date for Short term goals 12/07/23   1.  Patient will demonstrate independent use of home exercise program to maintain progress from in clinic treatments.  Baseline: See objective data Goal status: MET, 12/08/2023  2. Patient will demonstrate TUG </= 12 seconds without use of UE for turning.  Baseline: See objective data Goal status: MET,  12/21/2023  3. BERG score >/= 25/56 Baseline: See objective data Goal status: MET, 12/08/2023  LONG TERM GOALS: target dates for all long term goals 02/04/24   1. Patient will demonstrate/report pain at worst less than or equal to 2/10 to facilitate minimal limitation in daily activity secondary to pain symptoms. Baseline: See objective data Goal status: ongoing   01/05/2024   2. Patient will demonstrate independent use of home exercise program to facilitate ability to maintain/progress functional gains from skilled physical therapy services. Baseline: See objective data Goal status: ongoing   01/05/2024  3.  BERG score >/= 30/56 for MDIC  Baseline: SEE OBJECTIVE DATA Goal status: ongoing   01/05/2024   4. Patient will demonstrate TUG </= 10 seconds without use of UE for turning.  Baseline: See objective data Goal status: ongoing   01/05/2024   5.  Patient will report no falls within the last 4 weeks of PT Baseline: See objective data Goal status:ongoing   01/05/2024   6.  Patient will ambulate >  300' with appropriate assistive device with modified independence Baseline: See objective data Goal status: ongoing   01/05/2024   PLAN:  PT FREQUENCY:  2x/week  PT DURATION: 12 weeks  PLANNED INTERVENTIONS: 97164- PT Re-evaluation, 97750- Physical Performance Testing, 97110-Therapeutic exercises, 97530- Therapeutic activity, [id number]- Neuromuscular re-education, 97535- Self Care, [id number]- Gait training, Patient/Family education, Balance training, Stair training, and DME instructions  PLAN FOR NEXT SESSION:  check on PT recommendations for standing every 30 min during awake hours.    reactive balance, reaching balance, step progression, functional strengthening    Grayce Spatz, PT, DPT 01/05/2024, 1:10 PM

## 2024-01-07 ENCOUNTER — Encounter: Payer: Self-pay | Admitting: Physical Therapy

## 2024-01-07 ENCOUNTER — Ambulatory Visit: Admitting: Physical Therapy

## 2024-01-07 DIAGNOSIS — R296 Repeated falls: Secondary | ICD-10-CM

## 2024-01-07 DIAGNOSIS — R2689 Other abnormalities of gait and mobility: Secondary | ICD-10-CM

## 2024-01-07 DIAGNOSIS — R29898 Other symptoms and signs involving the musculoskeletal system: Secondary | ICD-10-CM

## 2024-01-07 DIAGNOSIS — G8929 Other chronic pain: Secondary | ICD-10-CM

## 2024-01-07 DIAGNOSIS — R2681 Unsteadiness on feet: Secondary | ICD-10-CM | POA: Diagnosis not present

## 2024-01-07 DIAGNOSIS — M6281 Muscle weakness (generalized): Secondary | ICD-10-CM

## 2024-01-07 DIAGNOSIS — M25561 Pain in right knee: Secondary | ICD-10-CM

## 2024-01-07 NOTE — Therapy (Signed)
 OUTPATIENT PHYSICAL THERAPY LOWER EXTREMITY TREATMENT   Patient Name: Gabriel Evans MRN: [id number] DOB:1940/12/09, 83 y.o., male Today's Date: 01/07/2024     END OF SESSION:  PT End of Session - 01/07/24 1156     Visit Number 15    Number of Visits 25    Date for PT Re-Evaluation 02/04/24    Authorization Type MEDICARE AND [employer]    Progress Note Due on Visit 20    PT Start Time 1151    PT Stop Time 1229    PT Time Calculation (min) 38 min    Equipment Utilized During Treatment Gait belt    Activity Tolerance Patient tolerated treatment well    Behavior During Therapy WFL for tasks assessed/performed               Past Medical History:  Diagnosis Date   Allergy    Cataract    Diabetes mellitus without complication (HCC)    Thyroid  disease    Past Surgical History:  Procedure Laterality Date   EYE SURGERY     FRACTURE SURGERY     JOINT REPLACEMENT     VASECTOMY     Patient Active Problem List   Diagnosis Date Noted   Pain in right elbow 05/29/2022   Pain in right knee 05/29/2022   Occlusion and stenosis of bilateral carotid arteries 06/26/2021   Diabetes (HCC) 03/19/2021   Skin lesion 03/19/2021   Callus 02/28/2020   Chronic kidney disease, stage 3a (HCC) 04/07/2019   Insomnia 04/07/2019   Benign prostatic hyperplasia with lower urinary tract symptoms 12/03/2017   Chronic obstructive pulmonary disease (HCC) 04/29/2017   Abnormal gait 02/03/2017   Anxiety disorder 02/03/2017   Benign neoplasm of colon 02/03/2017   Hyperlipidemia 02/03/2017   Polyneuropathy due to type 2 diabetes mellitus (HCC) 02/03/2017   Postoperative hypothyroidism 02/03/2017    PCP: Nichole Senior, MD  REFERRING PROVIDER: Persons, Ronal Dragon, PA   REFERRING DIAG: [phone] (ICD-10-CM) - Chronic pain of right knee   THERAPY DIAG:  Unsteadiness on feet  Repeated falls  Muscle weakness (generalized)  Other abnormalities of gait and mobility  Other symptoms and signs  involving the musculoskeletal system  Chronic pain of right knee  Rationale for Evaluation and Treatment: Rehabilitation  ONSET DATE: 10/27/23 PA referral to PT  SUBJECTIVE:   SUBJECTIVE STATEMENT:  When he went to [place] yesterday he stood up every 30 minutes and limited sitting with his legs crossed.  It helped but took away from pleasure of activity.   From Eval: Patient on 10/27/2023, reported to PA with chronic R knee and hamstring pain. Patient has history of falls and R sided weakness. He had a neurologic work up that indicated polyneuropathy. Patient reports to PT today after an initial fall estimated 8 weeks ago in which he landed on his R knee. He believes it occurred due to broken toe when stepping out of room. 4 weeks after the initial fall, he fell again on his R knee after his toe got caught when standing up from chair. His most recent fall was last night when he turned off the lights, turned around, fell and scrapped his elbow. When he stands up, he hears noise from his leg and feels unstable initially until he stands and walks. Patient notices his R knee doesn't hold his weight and reports symptoms of buckling.   PERTINENT HISTORY: Occlusion and stenosis of bilateral carotid arteries, diabetes, polyneuropathy, chronic kidney disease stage 3a, Benign prostatic hyperplasia  with lower urinary tract symptoms, COPD, Benign neoplasm of colon, Cataract, Joint replacement  PAIN:  Are you having pain? Yes: NPRS scale: today 0/10 Pain location: hamstring and calf Pain description: tightness and racheting Aggravating factors: initially standing up  Relieving factors: tylenol and taking a few steps   PRECAUTIONS: Fall  RED FLAGS: None   WEIGHT BEARING RESTRICTIONS: No  FALLS:  Has patient fallen in last 6 months? Yes. Number of falls 2-3x a month, injuries include broken toe, scrap on elbow, black eye  LIVING ENVIRONMENT: Lives with: lives with their spouse who has  disabilities  Lives in: [place], 2 story with master bedroom and bathroom downstairs  Stairs: Yes: Internal: 8+8 steps; on right going up and External: 4-5 steps; on right going up Has following equipment at home: Single point cane, Environmental consultant - 2 wheeled, Tour manager, and Grab bars  OCCUPATION: retired   PLOF: Independent  He reports repetitive falls   PATIENT GOALS:  Get rid of R knee pain. Improve strength and balance to decrease fall frequency   NEXT MD VISIT:  tbd   OBJECTIVE:  DIAGNOSTIC FINDINGS:  10/27/2023, Radiographs of his R knee show degenerative changes with no acute fractures noted   Patient-Specific Activity Scoring Scheme  0 represents "unable to perform." 10 represents "able to perform at prior level. 0 1 2 3 4 5 6 7 8 9  10 (Date and Score)   Activity Eval  11/09/23 12/30/2023   1. ADLs 5 6   2. Walking 5 6   3. Prevent falls 5 6  4.    5.    Score 5 6   Total score = sum of the activity scores/number of activities Minimum detectable change (90%CI) for average score = 2 points Minimum detectable change (90%CI) for single activity score = 3 points  COGNITION: Overall cognitive status: WFL    SENSATION: Patient states no sensation in his feet due to diabetes  MUSCLE LENGTH Hamstring (hip position at 90* and knee extending) R: - 38* L: -48*  POSTURE: rounded shoulders and forward head  LOWER EXTREMITY ROM:   ROM Right eval Left eval  Hip flexion    Hip extension    Hip abduction    Hip adduction    Hip internal rotation    Hip external rotation    Knee flexion    Knee extension    Ankle dorsiflexion P: - 6 P: - 8  Ankle plantarflexion    Ankle inversion    Ankle eversion     (Blank rows = not tested)  LOWER EXTREMITY MMT:  MMT Right eval Left eval  Hip flexion    Hip extension    Hip abduction    Hip adduction    Hip internal rotation    Hip external rotation    Knee flexion    Knee extension 5/5 5/5  Ankle dorsiflexion     Ankle plantarflexion    Ankle inversion    Ankle eversion     (Blank rows = not tested)   FUNCTIONAL TESTS:  12/21/2023 TUG: 11.91 seconds   12/08/2023:  Timed Up & Go: 12.13 seconds no AD  Berg Balance 39/56   11/23/2023 TUG: 13.25 sec with CGA Cognitive TUG:14.37 sec with CGA stopped naming during turns Gait Speed (self-selected):  1.29 ft/sec Gait Speed (fast): 1.39 ft/sec  11/09/2023 Berg Balance 21/56  GAIT Gait pattern: excessive arm swing for counterbalance BUEs, step through pattern, decreased step length- Right, decreased step length- Left, decreased  stride length, decreased hip/knee flexion- Right, decreased hip/knee flexion- Left, knee flexed in stance- Right, lateral lean- Left, trunk flexed, and wide BOS Distance walked: 100' x 2 Assistive device utilized: None Level of assistance: SBA / verbal cues needed for gait deviations  TODAY'S TREATMENT                                                                          DATE:  01/07/2024  Therapeutic Exercise:  Recumbent bike seat 8, level 3 for 8 minutes working on knee range and fluency of repetitive motion.  Therapeutic Activities: PT educated pt on using phone timer to help remind him to stand which should enable less focus on clock so he can enjoy his activities.  Pt verbalized understanding.  Step up & step down 6 box BUE support // bars 10 reps with ea LE.  PT cues on technique.  Lateral step up/down to right & to left 10 reps each 6 box with BUE support //bars.  PT demo & verbal cues on technique.  Step over hurdle forward/back alternating LEs with PT demo and cues on technique.  6 hurdle 10 reps intermittent UE touch on //bars and 9 hurdle 10 reps with LUE support //bars.  Stairs flight 11 steps with 2 rails alternating pattern with supervision with moderately fluent motions.  Flight 11 steps left rail alternating pattern slow guarded speed. Side stepping up/down BUEs on single rail to right 5 steps and to  left 5 steps.  PT verbally educated on functional benefits to using stairs as above. Pt verbalized understanding.     TREATMENT                                                                          DATE:  01/05/2024  Therapeutic Exercise:  Recumbent bike seat 8, level 3 for 8 minutes working on knee range and fluency of repetitive motion.  PT educated on HEP including muscle endurance component.  Seated exercises are less intensity than standing so may be able to increase time better.  Pt verbalized understanding.  PT educated pt on well-rounded program includes flexibility, strength, balance & muscle endurance.  PT recommended flexibility daily.  And the other 3 components 3-5x/wk.  Pt verbalized understanding.  Standing gastroc stretch 30 sec ea LE 3 reps. Seated hamstring stretch with strap 30 sec 3 reps.  Self-Care Patient reports that he sits for ~4 hours in [place] most days.  Then he is very stiff when he tries to get up.  PT recommended setting a timer and standing every 30 min.  He can do standing gastroc stretch, march in place, weight shift, etc.  He verbalized understanding including rationale.      TREATMENT  DATE:  12/30/2023  Therapeutic Exercise:  Recumbent bike seat 8, level 3 for 8 minutes working on knee range and fluency of repetitive motion  Neuro Re-Ed Tandem walking on floor Tandem walking on foam including transition firm to/from compliant surfaces with UE regression: BUE, Rt UE only, 2 fingers, no UE. Patient had moderate sway with 2 fingers and no UE. Was not able to successfully do no UE. Required cues to slow down to control balance. Lateral walking on foam including transition firm to/from compliant surfaces with UE regression: BUE, Rt UE only, 2 fingers, no UE. Patient had moderate sway with 2 fingers and no UE.  Patient was successful with 1 or 2 lateral steps with no UE.  Required cues  to slow down to control balance.  TherAct PT verbally educated and demoed to patient how to properly weight-bear over LE when performing STS. Patient verbalized and demo understanding STS from 25 inch table with no UE support.  Patient had a difficult time properly weightbearing over LE and controlled sit .  Patient's first STS is always the most unsteady, usually requiring him to use external support to not fall. 2x10 STS from 22 inch table with no UE support. Patient had a difficult time properly weightbearing over LE and controlled sit.  Patient reported this was a harder task. 3x  Updated PFPS    PATIENT EDUCATION:  Education details: POC Person educated: Patient Education method: Explanation, Verbal cues, handout Education comprehension: verbalized understanding  HOME EXERCISE PROGRAM: Access Code: [email or web address] URL: [email or web address] Date: 12/10/2023 Prepared by: Ismael Theophilus Stallion  Exercises - Gastroc Stretch on Step  - 1-2 x daily - 7 x weekly - 1 sets - 3 reps - 30 seconds hold - Seated Hamstring Stretch with Strap  - 1-2 x daily - 7 x weekly - 1 sets - 3 reps - 30 seconds hold - wide stance head motions eyes open  - 1 x daily - 4 x weekly - 1 sets - 5-10 reps - 2 seconds hold - Standing Balance with Eyes Closed  - 1 x daily - 4 x weekly - 1 sets - 3 reps - 10 seconds hold - Feet Apart with Eyes Closed with Head Motions  - 1 x daily - 4 x weekly - 1 sets - 5-10 reps - 2 seconds hold - Wide stance on Foam Pad head movements  - 1 x daily - 4 x weekly - 1 sets - 5-10 reps - 2 seconds hold - Squat with Chair and Counter Support  - 1 x daily - 4-7 x weekly - 2 sets - 10 reps - 5 seconds hold - sit to stand and stand to sit  - 1 x daily - 4-7 x weekly - 2 sets - 10 reps - 5 seconds hold - Carioca with Counter Support  - 1 x daily - 4-7 x weekly - 2 sets - 10 reps - Standing Terminal Knee Extension with Resistance  - 1 x daily - 7 x weekly - 2-3 sets - 10 reps  - 3-5 seconds hold  ASSESSMENT: CLINICAL IMPRESSION: PT worked on step up/down progressing to stairs.  He appeared to improve flexion / extension alternating motions for function.   Patient will continue to benefit from skilled PT to address impairments and minimize falls.  OBJECTIVE IMPAIRMENTS: Abnormal gait, cardiopulmonary status limiting activity, decreased balance, decreased endurance, decreased knowledge of use of DME, decreased mobility, decreased ROM, decreased strength, impaired flexibility, impaired sensation, postural dysfunction, and pain.  ACTIVITY LIMITATIONS: carrying, lifting, standing, stairs, transfers, and locomotion level  PARTICIPATION LIMITATIONS: meal prep, cleaning, laundry, community activity, and yard work  PERSONAL FACTORS: Age, Fitness, Past/current experiences, Time since onset of injury/illness/exacerbation, and 3+ comorbidities: see PMH are also affecting patient's functional outcome.   REHAB POTENTIAL: Good  CLINICAL DECISION MAKING: Evolving/moderate complexity  EVALUATION COMPLEXITY: Moderate   GOALS: Goals reviewed with patient? Yes  SHORT TERM GOALS: target date for Short term goals 12/07/23   1.  Patient will demonstrate independent use of home exercise program to maintain progress from in clinic treatments.  Baseline: See objective data Goal status: MET, 12/08/2023  2. Patient will demonstrate TUG </= 12 seconds without use of UE for turning.  Baseline: See objective data Goal status: MET,  12/21/2023  3. BERG score >/= 25/56 Baseline: See objective data Goal status: MET, 12/08/2023  LONG TERM GOALS: target dates for all long term goals 02/04/24   1. Patient will demonstrate/report pain at worst less than or equal to 2/10 to facilitate minimal limitation in daily activity secondary to pain symptoms. Baseline: See objective data Goal status: ongoing   01/05/2024   2. Patient will demonstrate independent use of home exercise program to  facilitate ability to maintain/progress functional gains from skilled physical therapy services. Baseline: See objective data Goal status: ongoing   01/05/2024  3.  BERG score >/= 30/56 for MDIC  Baseline: SEE OBJECTIVE DATA Goal status: ongoing   01/05/2024   4. Patient will demonstrate TUG </= 10 seconds without use of UE for turning.  Baseline: See objective data Goal status: ongoing   01/05/2024   5.  Patient will report no falls within the last 4 weeks of PT Baseline: See objective data Goal status:ongoing   01/05/2024   6.  Patient will ambulate > 300' with appropriate assistive device with modified independence Baseline: See objective data Goal status: ongoing   01/05/2024   PLAN:  PT FREQUENCY:  2x/week  PT DURATION: 12 weeks  PLANNED INTERVENTIONS: 97164- PT Re-evaluation, 97750- Physical Performance Testing, 97110-Therapeutic exercises, 97530- Therapeutic activity, 97112- Neuromuscular re-education, [phone]- Self Care, [id number]- Gait training, Patient/Family education, Balance training, Stair training, and DME instructions  PLAN FOR NEXT SESSION:    balance activities, step progression, functional strengthening    Grayce Spatz, PT, DPT 01/07/2024, 8:53 PM

## 2024-01-11 ENCOUNTER — Ambulatory Visit (INDEPENDENT_AMBULATORY_CARE_PROVIDER_SITE_OTHER): Admitting: Rehabilitative and Restorative Service Providers"

## 2024-01-11 ENCOUNTER — Encounter: Payer: Self-pay | Admitting: Rehabilitative and Restorative Service Providers"

## 2024-01-11 DIAGNOSIS — M6281 Muscle weakness (generalized): Secondary | ICD-10-CM

## 2024-01-11 DIAGNOSIS — R296 Repeated falls: Secondary | ICD-10-CM | POA: Diagnosis not present

## 2024-01-11 DIAGNOSIS — R2689 Other abnormalities of gait and mobility: Secondary | ICD-10-CM

## 2024-01-11 DIAGNOSIS — M25561 Pain in right knee: Secondary | ICD-10-CM

## 2024-01-11 DIAGNOSIS — G8929 Other chronic pain: Secondary | ICD-10-CM

## 2024-01-11 DIAGNOSIS — R29898 Other symptoms and signs involving the musculoskeletal system: Secondary | ICD-10-CM

## 2024-01-11 DIAGNOSIS — R2681 Unsteadiness on feet: Secondary | ICD-10-CM | POA: Diagnosis not present

## 2024-01-11 NOTE — Therapy (Addendum)
 OUTPATIENT PHYSICAL THERAPY LOWER EXTREMITY TREATMENT   Patient Name: Gabriel Evans MRN: [id number] DOB:1941-02-23, 83 y.o., male Today's Date: 01/11/2024     END OF SESSION:  PT End of Session - 01/11/24 1147     Visit Number 16    Number of Visits 25    Date for PT Re-Evaluation 02/04/24    Authorization Type MEDICARE AND [employer]    Progress Note Due on Visit 20    PT Start Time 1147    PT Stop Time 1231    PT Time Calculation (min) 44 min    Equipment Utilized During Treatment Gait belt    Activity Tolerance Patient tolerated treatment well    Behavior During Therapy WFL for tasks assessed/performed                Past Medical History:  Diagnosis Date   Allergy    Cataract    Diabetes mellitus without complication (HCC)    Thyroid  disease    Past Surgical History:  Procedure Laterality Date   EYE SURGERY     FRACTURE SURGERY     JOINT REPLACEMENT     VASECTOMY     Patient Active Problem List   Diagnosis Date Noted   Pain in right elbow 05/29/2022   Pain in right knee 05/29/2022   Occlusion and stenosis of bilateral carotid arteries 06/26/2021   Diabetes (HCC) 03/19/2021   Skin lesion 03/19/2021   Callus 02/28/2020   Chronic kidney disease, stage 3a (HCC) 04/07/2019   Insomnia 04/07/2019   Benign prostatic hyperplasia with lower urinary tract symptoms 12/03/2017   Chronic obstructive pulmonary disease (HCC) 04/29/2017   Abnormal gait 02/03/2017   Anxiety disorder 02/03/2017   Benign neoplasm of colon 02/03/2017   Hyperlipidemia 02/03/2017   Polyneuropathy due to type 2 diabetes mellitus (HCC) 02/03/2017   Postoperative hypothyroidism 02/03/2017    PCP: Nichole Senior, MD  REFERRING PROVIDER: Persons, Ronal Dragon, PA   REFERRING DIAG: [phone] (ICD-10-CM) - Chronic pain of right knee   THERAPY DIAG:  Unsteadiness on feet  Repeated falls  Muscle weakness (generalized)  Other abnormalities of gait and mobility  Other symptoms and  signs involving the musculoskeletal system  Chronic pain of right knee  Rationale for Evaluation and Treatment: Rehabilitation  ONSET DATE: 10/27/23 PA referral to PT  SUBJECTIVE:   SUBJECTIVE STATEMENT:  Patient reports some back and knee pain. Patient reports using the alarm on his phone for more standing time and feels like it makes his symptoms better, except it flares up his back pain.     PERTINENT HISTORY: Occlusion and stenosis of bilateral carotid arteries, diabetes, polyneuropathy, chronic kidney disease stage 3a, Benign prostatic hyperplasia with lower urinary tract symptoms, COPD, Benign neoplasm of colon, Cataract, Joint replacement  PAIN:   Are you having pain? Yes: NPRS scale: today 2-3/10 Pain location: hamstring and calf Pain description: tightness and racheting Aggravating factors: initially standing up  Relieving factors: tylenol and taking a few steps   PRECAUTIONS: Fall  RED FLAGS: None   WEIGHT BEARING RESTRICTIONS: No  FALLS:  Has patient fallen in last 6 months? Yes. Number of falls 2-3x a month, injuries include broken toe, scrap on elbow, black eye  LIVING ENVIRONMENT: Lives with: lives with their spouse who has disabilities  Lives in: [place], 2 story with master bedroom and bathroom downstairs  Stairs: Yes: Internal: 8+8 steps; on right going up and External: 4-5 steps; on right going up Has following equipment at home: Single  point cane, Walker - 2 wheeled, Tour manager, and Grab bars  OCCUPATION: retired   PLOF: Independent  He reports repetitive falls   PATIENT GOALS:  Get rid of R knee pain. Improve strength and balance to decrease fall frequency   NEXT MD VISIT:  tbd   OBJECTIVE:  DIAGNOSTIC FINDINGS:  10/27/2023, Radiographs of his R knee show degenerative changes with no acute fractures noted   Patient-Specific Activity Scoring Scheme  0 represents "unable to perform." 10 represents "able to perform at prior level. 0 1 2 3 4 5  6 7 8 9 10  (Date and Score)   Activity Eval  11/09/23 12/30/2023   1. ADLs 5 6   2. Walking 5 6   3. Prevent falls 5 6  4.    5.    Score 5 6   Total score = sum of the activity scores/number of activities Minimum detectable change (90%CI) for average score = 2 points Minimum detectable change (90%CI) for single activity score = 3 points  COGNITION: Overall cognitive status: WFL    SENSATION: Patient states no sensation in his feet due to diabetes  MUSCLE LENGTH Hamstring (hip position at 90* and knee extending) R: - 38* L: -48*  POSTURE: rounded shoulders and forward head  LOWER EXTREMITY ROM:   ROM Right eval Left eval  Hip flexion    Hip extension    Hip abduction    Hip adduction    Hip internal rotation    Hip external rotation    Knee flexion    Knee extension    Ankle dorsiflexion P: - 6 P: - 8  Ankle plantarflexion    Ankle inversion    Ankle eversion     (Blank rows = not tested)  LOWER EXTREMITY MMT:  MMT Right eval Left eval  Hip flexion    Hip extension    Hip abduction    Hip adduction    Hip internal rotation    Hip external rotation    Knee flexion    Knee extension 5/5 5/5  Ankle dorsiflexion    Ankle plantarflexion    Ankle inversion    Ankle eversion     (Blank rows = not tested)   FUNCTIONAL TESTS:  12/21/2023 TUG: 11.91 seconds   12/08/2023:  Timed Up & Go: 12.13 seconds no AD  Berg Balance 39/56   11/23/2023 TUG: 13.25 sec with CGA Cognitive TUG:14.37 sec with CGA stopped naming during turns Gait Speed (self-selected):  1.29 ft/sec Gait Speed (fast): 1.39 ft/sec  11/09/2023 Berg Balance 21/56  GAIT 01/11/2024: Ambulation independent with variable postural sway and moments of LOB requiring stepping to correct.    Eval:   Gait pattern: excessive arm swing for counterbalance BUEs, step through pattern, decreased step length- Right, decreased step length- Left, decreased stride length, decreased hip/knee flexion- Right,  decreased hip/knee flexion- Left, knee flexed in stance- Right, lateral lean- Left, trunk flexed, and wide BOS Distance walked: 100' x 2 Assistive device utilized: None Level of assistance: SBA / verbal cues needed for gait deviations                  TODAY'S TREATMENT  DATE: 01/11/2024  Therapeutic Exercise:  Recumbent bike seat 8, level 3 for 8 minutes working on knee range and fluency of repetitive motion.  Neuro Re-Ed (working on standing balance) Reaching blaze pod activity, 4x45 seconds on floor and then on foam. Reaching with single arm independently. Patient had most difficulty with on foam when crossing their arm across the body and reaching the highest blaze pod Standing with feet together w/ eyes open and close, 30 seconds each  Standing in semi-tandem stance w/ eyes open and close, 30 seconds each   TherAct (facilitation of STS and transfers) Step up & step down 6 box BUE support // bars 2x8 reps with ea LE Lateral step up/down to right & to left 2x8 reps each 6 box with BUE support //bars  TREATMENT                                                                          DATE:  01/07/2024  Therapeutic Exercise:  Recumbent bike seat 8, level 3 for 8 minutes working on knee range and fluency of repetitive motion.  Therapeutic Activities: PT educated pt on using phone timer to help remind him to stand which should enable less focus on clock so he can enjoy his activities.  Pt verbalized understanding.  Step up & step down 6 box BUE support // bars 10 reps with ea LE.  PT cues on technique.  Lateral step up/down to right & to left 10 reps each 6 box with BUE support //bars.  PT demo & verbal cues on technique.  Step over hurdle forward/back alternating LEs with PT demo and cues on technique.  6 hurdle 10 reps intermittent UE touch on //bars and 9 hurdle 10 reps with LUE support //bars.  Stairs flight 11  steps with 2 rails alternating pattern with supervision with moderately fluent motions.  Flight 11 steps left rail alternating pattern slow guarded speed. Side stepping up/down BUEs on single rail to right 5 steps and to left 5 steps.  PT verbally educated on functional benefits to using stairs as above. Pt verbalized understanding.    TREATMENT                                                                          DATE:  01/05/2024  Therapeutic Exercise:  Recumbent bike seat 8, level 3 for 8 minutes working on knee range and fluency of repetitive motion.  PT educated on HEP including muscle endurance component.  Seated exercises are less intensity than standing so may be able to increase time better.  Pt verbalized understanding.  PT educated pt on well-rounded program includes flexibility, strength, balance & muscle endurance.  PT recommended flexibility daily.  And the other 3 components 3-5x/wk.  Pt verbalized understanding.  Standing gastroc stretch 30 sec ea LE 3 reps. Seated hamstring stretch with strap 30 sec 3 reps.  Self-Care Patient reports that he sits for ~4 hours in [place]  most days.  Then he is very stiff when he tries to get up.  PT recommended setting a timer and standing every 30 min.  He can do standing gastroc stretch, march in place, weight shift, etc.  He verbalized understanding including rationale.     PATIENT EDUCATION:  Education details: POC Person educated: Patient Education method: Explanation, Verbal cues, handout Education comprehension: verbalized understanding  HOME EXERCISE PROGRAM: Access Code: [email or web address] URL: [email or web address] Date: 12/10/2023 Prepared by: Ismael Theophilus Stallion  Exercises - Gastroc Stretch on Step  - 1-2 x daily - 7 x weekly - 1 sets - 3 reps - 30 seconds hold - Seated Hamstring Stretch with Strap  - 1-2 x daily - 7 x weekly - 1 sets - 3 reps - 30 seconds hold - wide stance head motions eyes open  - 1 x daily  - 4 x weekly - 1 sets - 5-10 reps - 2 seconds hold - Standing Balance with Eyes Closed  - 1 x daily - 4 x weekly - 1 sets - 3 reps - 10 seconds hold - Feet Apart with Eyes Closed with Head Motions  - 1 x daily - 4 x weekly - 1 sets - 5-10 reps - 2 seconds hold - Wide stance on Foam Pad head movements  - 1 x daily - 4 x weekly - 1 sets - 5-10 reps - 2 seconds hold - Squat with Chair and Counter Support  - 1 x daily - 4-7 x weekly - 2 sets - 10 reps - 5 seconds hold - sit to stand and stand to sit  - 1 x daily - 4-7 x weekly - 2 sets - 10 reps - 5 seconds hold - Carioca with Counter Support  - 1 x daily - 4-7 x weekly - 2 sets - 10 reps - Standing Terminal Knee Extension with Resistance  - 1 x daily - 7 x weekly - 2-3 sets - 10 reps - 3-5 seconds hold  ASSESSMENT: CLINICAL IMPRESSION:  Patient overall did well today. Patient demonstrated improvement with standing balance as seen by accomplishing semi-tandem stance balance activity with no major LOB. Patient continues to have difficulty with reaching balance and does not use the stepping strategy well when he has a moment of instability. Patient will continue to benefit from skilled PT to address impairments and minimize falls.  OBJECTIVE IMPAIRMENTS: Abnormal gait, cardiopulmonary status limiting activity, decreased balance, decreased endurance, decreased knowledge of use of DME, decreased mobility, decreased ROM, decreased strength, impaired flexibility, impaired sensation, postural dysfunction, and pain.   ACTIVITY LIMITATIONS: carrying, lifting, standing, stairs, transfers, and locomotion level  PARTICIPATION LIMITATIONS: meal prep, cleaning, laundry, community activity, and yard work  PERSONAL FACTORS: Age, Fitness, Past/current experiences, Time since onset of injury/illness/exacerbation, and 3+ comorbidities: see PMH are also affecting patient's functional outcome.   REHAB POTENTIAL: Good  CLINICAL DECISION MAKING: Evolving/moderate  complexity  EVALUATION COMPLEXITY: Moderate   GOALS: Goals reviewed with patient? Yes  SHORT TERM GOALS: target date for Short term goals 12/07/23   1.  Patient will demonstrate independent use of home exercise program to maintain progress from in clinic treatments.  Baseline: See objective data Goal status: MET, 12/08/2023  2. Patient will demonstrate TUG </= 12 seconds without use of UE for turning.  Baseline: See objective data Goal status: MET,  12/21/2023  3. BERG score >/= 25/56 Baseline: See objective data Goal status: MET, 12/08/2023  LONG TERM GOALS: target dates for all long term goals 02/04/24  1. Patient will demonstrate/report pain at worst less than or equal to 2/10 to facilitate minimal limitation in daily activity secondary to pain symptoms. Baseline: See objective data Goal status: ongoing   01/05/2024   2. Patient will demonstrate independent use of home exercise program to facilitate ability to maintain/progress functional gains from skilled physical therapy services. Baseline: See objective data Goal status: ongoing   01/05/2024  3.  BERG score >/= 30/56 for MDIC  Baseline: SEE OBJECTIVE DATA Goal status: ongoing   01/05/2024   4. Patient will demonstrate TUG </= 10 seconds without use of UE for turning.  Baseline: See objective data Goal status: ongoing   01/05/2024   5.  Patient will report no falls within the last 4 weeks of PT Baseline: See objective data Goal status:ongoing   01/05/2024   6.  Patient will ambulate > 300' with appropriate assistive device with modified independence Baseline: See objective data Goal status: ongoing   01/05/2024   PLAN:  PT FREQUENCY:  2x/week  PT DURATION: 12 weeks  PLANNED INTERVENTIONS: 97164- PT Re-evaluation, 97750- Physical Performance Testing, 97110-Therapeutic exercises, 97530- Therapeutic activity, 97112- Neuromuscular re-education, [phone]- Self Care, [id number]- Gait training, Patient/Family education, Balance  training, Stair training, and DME instructions  PLAN FOR NEXT SESSION:   static/dynamic balance, functional strengthening,   Ismael Nap, Student-PT 01/11/2024, 3:47 PM

## 2024-01-13 ENCOUNTER — Encounter: Payer: Self-pay | Admitting: Rehabilitative and Restorative Service Providers"

## 2024-01-13 ENCOUNTER — Ambulatory Visit: Admitting: Rehabilitative and Restorative Service Providers"

## 2024-01-13 DIAGNOSIS — M6281 Muscle weakness (generalized): Secondary | ICD-10-CM

## 2024-01-13 DIAGNOSIS — R296 Repeated falls: Secondary | ICD-10-CM

## 2024-01-13 DIAGNOSIS — R2681 Unsteadiness on feet: Secondary | ICD-10-CM

## 2024-01-13 DIAGNOSIS — R29898 Other symptoms and signs involving the musculoskeletal system: Secondary | ICD-10-CM

## 2024-01-13 DIAGNOSIS — R2689 Other abnormalities of gait and mobility: Secondary | ICD-10-CM

## 2024-01-13 DIAGNOSIS — G8929 Other chronic pain: Secondary | ICD-10-CM

## 2024-01-13 DIAGNOSIS — M25561 Pain in right knee: Secondary | ICD-10-CM

## 2024-01-13 NOTE — Therapy (Cosign Needed)
 OUTPATIENT PHYSICAL THERAPY LOWER EXTREMITY TREATMENT   Patient Name: Gabriel Evans MRN: [id number] DOB:06-Mar-1941, 83 y.o., male Today's Date: 01/13/2024     END OF SESSION:  PT End of Session - 01/13/24 1143     Visit Number 17    Number of Visits 25    Date for PT Re-Evaluation 02/04/24    Authorization Type MEDICARE AND [employer]    Progress Note Due on Visit 20    PT Start Time 1144    PT Stop Time 1230    PT Time Calculation (min) 46 min    Equipment Utilized During Treatment Gait belt    Activity Tolerance Patient tolerated treatment well    Behavior During Therapy WFL for tasks assessed/performed                 Past Medical History:  Diagnosis Date   Allergy    Cataract    Diabetes mellitus without complication (HCC)    Thyroid  disease    Past Surgical History:  Procedure Laterality Date   EYE SURGERY     FRACTURE SURGERY     JOINT REPLACEMENT     VASECTOMY     Patient Active Problem List   Diagnosis Date Noted   Pain in right elbow 05/29/2022   Pain in right knee 05/29/2022   Occlusion and stenosis of bilateral carotid arteries 06/26/2021   Diabetes (HCC) 03/19/2021   Skin lesion 03/19/2021   Callus 02/28/2020   Chronic kidney disease, stage 3a (HCC) 04/07/2019   Insomnia 04/07/2019   Benign prostatic hyperplasia with lower urinary tract symptoms 12/03/2017   Chronic obstructive pulmonary disease (HCC) 04/29/2017   Abnormal gait 02/03/2017   Anxiety disorder 02/03/2017   Benign neoplasm of colon 02/03/2017   Hyperlipidemia 02/03/2017   Polyneuropathy due to type 2 diabetes mellitus (HCC) 02/03/2017   Postoperative hypothyroidism 02/03/2017    PCP: Nichole Senior, MD  REFERRING PROVIDER: Persons, Ronal Dragon, PA   REFERRING DIAG: [phone] (ICD-10-CM) - Chronic pain of right knee   THERAPY DIAG:  Unsteadiness on feet  Repeated falls  Muscle weakness (generalized)  Other abnormalities of gait and mobility  Other symptoms and  signs involving the musculoskeletal system  Chronic pain of right knee  Rationale for Evaluation and Treatment: Rehabilitation  ONSET DATE: 10/27/23 PA referral to PT  SUBJECTIVE:   SUBJECTIVE STATEMENT:   Patient reports a fall last night when turning around to turn off light. Patient did not hit his head but his back hurts. He's been having previous back pain.    PERTINENT HISTORY: Occlusion and stenosis of bilateral carotid arteries, diabetes, polyneuropathy, chronic kidney disease stage 3a, Benign prostatic hyperplasia with lower urinary tract symptoms, COPD, Benign neoplasm of colon, Cataract, Joint replacement  PAIN:   NPRS scale: today 2-3/10 Pain location: hamstring and calf Pain description: tightness and racheting Aggravating factors: initially standing up  Relieving factors: tylenol and taking a few steps   PRECAUTIONS: Fall  RED FLAGS: None   WEIGHT BEARING RESTRICTIONS: No  FALLS:  Has patient fallen in last 6 months? Yes. Number of falls 2-3x a month, injuries include broken toe, scrap on elbow, black eye  LIVING ENVIRONMENT: Lives with: lives with their spouse who has disabilities  Lives in: [place], 2 story with master bedroom and bathroom downstairs  Stairs: Yes: Internal: 8+8 steps; on right going up and External: 4-5 steps; on right going up Has following equipment at home: Single point cane, Environmental consultant - 2 wheeled, Tour manager, and  Grab bars  OCCUPATION: retired   PLOF: Independent  He reports repetitive falls   PATIENT GOALS:  Get rid of R knee pain. Improve strength and balance to decrease fall frequency   NEXT MD VISIT:  tbd   OBJECTIVE:  DIAGNOSTIC FINDINGS:  10/27/2023, Radiographs of his R knee show degenerative changes with no acute fractures noted   Patient-Specific Activity Scoring Scheme  0 represents "unable to perform." 10 represents "able to perform at prior level. 0 1 2 3 4 5 6 7 8 9  10 (Date and Score)   Activity Eval  11/09/23  12/30/2023   1. ADLs 5 6   2. Walking 5 6   3. Prevent falls 5 6  4.    5.    Score 5 6   Total score = sum of the activity scores/number of activities Minimum detectable change (90%CI) for average score = 2 points Minimum detectable change (90%CI) for single activity score = 3 points  COGNITION: Overall cognitive status: WFL    SENSATION: Patient states no sensation in his feet due to diabetes  MUSCLE LENGTH Hamstring (hip position at 90* and knee extending) R: - 38* L: -48*  POSTURE: rounded shoulders and forward head  LOWER EXTREMITY ROM:   ROM Right eval Left eval  Hip flexion    Hip extension    Hip abduction    Hip adduction    Hip internal rotation    Hip external rotation    Knee flexion    Knee extension    Ankle dorsiflexion P: - 6 P: - 8  Ankle plantarflexion    Ankle inversion    Ankle eversion     (Blank rows = not tested)  LOWER EXTREMITY MMT:  MMT Right eval Left eval Right 01/13/2024 Left 01/13/2024  Hip flexion      Hip extension   3+/5 4/5  Hip abduction   5/5 5/5  Hip adduction      Hip internal rotation      Hip external rotation      Knee flexion      Knee extension 5/5 5/5    Ankle dorsiflexion      Ankle plantarflexion      Ankle inversion      Ankle eversion       (Blank rows = not tested)   FUNCTIONAL TESTS:  12/21/2023 TUG: 11.91 seconds   12/08/2023:  Timed Up & Go: 12.13 seconds no AD  Berg Balance 39/56   11/23/2023 TUG: 13.25 sec with CGA Cognitive TUG:14.37 sec with CGA stopped naming during turns Gait Speed (self-selected):  1.29 ft/sec Gait Speed (fast): 1.39 ft/sec  11/09/2023 Berg Balance 21/56  GAIT 01/11/2024: Ambulation independent with variable postural sway and moments of LOB requiring stepping to correct.    Eval:   Gait pattern: excessive arm swing for counterbalance BUEs, step through pattern, decreased step length- Right, decreased step length- Left, decreased stride length, decreased hip/knee  flexion- Right, decreased hip/knee flexion- Left, knee flexed in stance- Right, lateral lean- Left, trunk flexed, and wide BOS Distance walked: 100' x 2 Assistive device utilized: None Level of assistance: SBA / verbal cues needed for gait deviations                  TODAY'S TREATMENT  DATE: 01/13/2024  Therapeutic Exercise:  Recumbent bike seat 8, level 2 for 9 minutes working on range and fluency on movement  Glute bride, 2x10 Supine clamshells, 2x10 with green resistance band  Continued education on importance of interventions to help with all impairments to reduce fall risk.   Neuro Re-Ed [place] with bilateral UE support, 3 laps  Weight shift bwd/fwd ankle strategy 2 mins with SBA and consistent cues.  Step over three hurdles w/ bilateral UE support, 4 laps   MMT measurements taken   TODAY'S TREATMENT                                                                          DATE: 01/11/2024  Therapeutic Exercise:  Recumbent bike seat 8, level 3 for 8 minutes working on knee range and fluency of repetitive motion.  Neuro Re-Ed (working on standing balance) Reaching blaze pod activity, 4x45 seconds on floor and then on foam. Reaching with single arm independently. Patient had most difficulty with on foam when crossing their arm across the body and reaching the highest blaze pod Standing with feet together w/ eyes open and close, 30 seconds each  Standing in semi-tandem stance w/ eyes open and close, 30 seconds each   TherAct (facilitation of STS and transfers) Step up & step down 6 box BUE support // bars 2x8 reps with ea LE Lateral step up/down to right & to left 2x8 reps each 6 box with BUE support //bars  TREATMENT                                                                          DATE:  01/07/2024  Therapeutic Exercise:  Recumbent bike seat 8, level 3 for 8 minutes working on knee range and fluency  of repetitive motion.  Therapeutic Activities: PT educated pt on using phone timer to help remind him to stand which should enable less focus on clock so he can enjoy his activities.  Pt verbalized understanding.  Step up & step down 6 box BUE support // bars 10 reps with ea LE.  PT cues on technique.  Lateral step up/down to right & to left 10 reps each 6 box with BUE support //bars.  PT demo & verbal cues on technique.  Step over hurdle forward/back alternating LEs with PT demo and cues on technique.  6 hurdle 10 reps intermittent UE touch on //bars and 9 hurdle 10 reps with LUE support //bars.  Stairs flight 11 steps with 2 rails alternating pattern with supervision with moderately fluent motions.  Flight 11 steps left rail alternating pattern slow guarded speed. Side stepping up/down BUEs on single rail to right 5 steps and to left 5 steps.  PT verbally educated on functional benefits to using stairs as above. Pt verbalized understanding.   PATIENT EDUCATION:  Education details: POC Person educated: Patient Education method: Explanation, Verbal cues, handout Education comprehension: verbalized understanding  HOME EXERCISE PROGRAM: Access Code:  [email or web address] URL: [email or web address] Date: 01/13/2024 Prepared by: Ozell Silvan   Exercises - Gastroc Stretch on Step  - 1-2 x daily - 7 x weekly - 1 sets - 3 reps - 30 seconds hold - Seated Hamstring Stretch with Strap  - 1-2 x daily - 7 x weekly - 1 sets - 3 reps - 30 seconds hold - wide stance head motions eyes open  - 1 x daily - 4 x weekly - 1 sets - 5-10 reps - 2 seconds hold - Standing Balance with Eyes Closed  - 1 x daily - 4 x weekly - 1 sets - 3 reps - 10 seconds hold - Feet Apart with Eyes Closed with Head Motions  - 1 x daily - 4 x weekly - 1 sets - 5-10 reps - 2 seconds hold - Wide stance on Foam Pad head movements  - 1 x daily - 4 x weekly - 1 sets - 5-10 reps - 2 seconds hold - Squat with Chair and Counter  Support  - 1 x daily - 4-7 x weekly - 2 sets - 10 reps - 5 seconds hold - sit to stand and stand to sit  - 1 x daily - 4-7 x weekly - 2 sets - 10 reps - 5 seconds hold - [place] with Counter Support  - 1 x daily - 4-7 x weekly - 2 sets - 10 reps - Standing Terminal Knee Extension with Resistance  - 1 x daily - 7 x weekly - 2-3 sets - 10 reps - 3-5 seconds hold - Supine Bridge  - 1-2 x daily - 7 x weekly - 1-2 sets - 10 reps - 2 hold - Hooklying Isometric Clamshell  - 1-2 x daily - 7 x weekly - 2-3 sets - 10-15 reps  ASSESSMENT: CLINICAL IMPRESSION:  Patient has glute weakness which impacts his ability to perform STS safely and controlled. Patient continues to have difficulty with balance exercises and uses external support to stop himself from falling instead of using other balance strategies such as stepping strategy. Patient will continue to benefit from skilled PT to address impairments and minimize falls.  OBJECTIVE IMPAIRMENTS: Abnormal gait, cardiopulmonary status limiting activity, decreased balance, decreased endurance, decreased knowledge of use of DME, decreased mobility, decreased ROM, decreased strength, impaired flexibility, impaired sensation, postural dysfunction, and pain.   ACTIVITY LIMITATIONS: carrying, lifting, standing, stairs, transfers, and locomotion level  PARTICIPATION LIMITATIONS: meal prep, cleaning, laundry, community activity, and yard work  PERSONAL FACTORS: Age, Fitness, Past/current experiences, Time since onset of injury/illness/exacerbation, and 3+ comorbidities: see PMH are also affecting patient's functional outcome.   REHAB POTENTIAL: Good  CLINICAL DECISION MAKING: Evolving/moderate complexity  EVALUATION COMPLEXITY: Moderate   GOALS: Goals reviewed with patient? Yes  SHORT TERM GOALS: target date for Short term goals 12/07/23   1.  Patient will demonstrate independent use of home exercise program to maintain progress from in clinic treatments.   Baseline: See objective data Goal status: MET, 12/08/2023  2. Patient will demonstrate TUG </= 12 seconds without use of UE for turning.  Baseline: See objective data Goal status: MET,  12/21/2023  3. BERG score >/= 25/56 Baseline: See objective data Goal status: MET, 12/08/2023  LONG TERM GOALS: target dates for all long term goals 02/04/24   1. Patient will demonstrate/report pain at worst less than or equal to 2/10 to facilitate minimal limitation in daily activity secondary to pain symptoms. Baseline: See objective data Goal status: ongoing   01/05/2024  2. Patient will demonstrate independent use of home exercise program to facilitate ability to maintain/progress functional gains from skilled physical therapy services. Baseline: See objective data Goal status: ongoing   01/05/2024  3.  BERG score >/= 30/56 for MDIC  Baseline: SEE OBJECTIVE DATA Goal status: ongoing   01/05/2024   4. Patient will demonstrate TUG </= 10 seconds without use of UE for turning.  Baseline: See objective data Goal status: ongoing   01/05/2024   5.  Patient will report no falls within the last 4 weeks of PT Baseline: See objective data Goal status:ongoing   01/05/2024   6.  Patient will ambulate > 300' with appropriate assistive device with modified independence Baseline: See objective data Goal status: ongoing   01/05/2024   PLAN:  PT FREQUENCY:  2x/week  PT DURATION: 12 weeks  PLANNED INTERVENTIONS: 97164- PT Re-evaluation, 97750- Physical Performance Testing, 97110-Therapeutic exercises, 97530- Therapeutic activity, 97112- Neuromuscular re-education, [phone]- Self Care, [id number]- Gait training, Patient/Family education, Balance training, Stair training, and DME instructions  PLAN FOR NEXT SESSION:  glute/abductor strengthening, weight shifting, step over/turning balance   Ismael Nap, Student-PT 01/13/2024, 4:55 PM

## 2024-01-18 ENCOUNTER — Ambulatory Visit (INDEPENDENT_AMBULATORY_CARE_PROVIDER_SITE_OTHER): Admitting: Rehabilitative and Restorative Service Providers"

## 2024-01-18 ENCOUNTER — Encounter: Payer: Self-pay | Admitting: Rehabilitative and Restorative Service Providers"

## 2024-01-18 DIAGNOSIS — R29898 Other symptoms and signs involving the musculoskeletal system: Secondary | ICD-10-CM

## 2024-01-18 DIAGNOSIS — R2689 Other abnormalities of gait and mobility: Secondary | ICD-10-CM | POA: Diagnosis not present

## 2024-01-18 DIAGNOSIS — R2681 Unsteadiness on feet: Secondary | ICD-10-CM

## 2024-01-18 DIAGNOSIS — M25561 Pain in right knee: Secondary | ICD-10-CM

## 2024-01-18 DIAGNOSIS — R296 Repeated falls: Secondary | ICD-10-CM

## 2024-01-18 DIAGNOSIS — M6281 Muscle weakness (generalized): Secondary | ICD-10-CM

## 2024-01-18 DIAGNOSIS — G8929 Other chronic pain: Secondary | ICD-10-CM

## 2024-01-18 NOTE — Therapy (Addendum)
 OUTPATIENT PHYSICAL THERAPY TREATMENT   Patient Name: Gabriel Evans MRN: [id number] DOB:20-Oct-1940, 83 y.o., male Today's Date: 01/18/2024     END OF SESSION:  PT End of Session - 01/18/24 1149     Visit Number 18    Number of Visits 25    Date for PT Re-Evaluation 02/04/24    Authorization Type MEDICARE AND [employer]    Progress Note Due on Visit 20    PT Start Time 1146    PT Stop Time 1230    PT Time Calculation (min) 44 min    Equipment Utilized During Treatment Gait belt    Activity Tolerance Patient tolerated treatment well    Behavior During Therapy WFL for tasks assessed/performed              Past Medical History:  Diagnosis Date   Allergy    Cataract    Diabetes mellitus without complication (HCC)    Thyroid  disease    Past Surgical History:  Procedure Laterality Date   EYE SURGERY     FRACTURE SURGERY     JOINT REPLACEMENT     VASECTOMY     Patient Active Problem List   Diagnosis Date Noted   Pain in right elbow 05/29/2022   Pain in right knee 05/29/2022   Occlusion and stenosis of bilateral carotid arteries 06/26/2021   Diabetes (HCC) 03/19/2021   Skin lesion 03/19/2021   Callus 02/28/2020   Chronic kidney disease, stage 3a (HCC) 04/07/2019   Insomnia 04/07/2019   Benign prostatic hyperplasia with lower urinary tract symptoms 12/03/2017   Chronic obstructive pulmonary disease (HCC) 04/29/2017   Abnormal gait 02/03/2017   Anxiety disorder 02/03/2017   Benign neoplasm of colon 02/03/2017   Hyperlipidemia 02/03/2017   Polyneuropathy due to type 2 diabetes mellitus (HCC) 02/03/2017   Postoperative hypothyroidism 02/03/2017    PCP: Nichole Senior, MD  REFERRING PROVIDER: Persons, Ronal Dragon, PA   REFERRING DIAG: [phone] (ICD-10-CM) - Chronic pain of right knee   THERAPY DIAG:  Unsteadiness on feet  Repeated falls  Muscle weakness (generalized)  Other abnormalities of gait and mobility  Other symptoms and signs involving the  musculoskeletal system  Chronic pain of right knee  Rationale for Evaluation and Treatment: Rehabilitation  ONSET DATE: 10/27/23 PA referral to PT  SUBJECTIVE:   SUBJECTIVE STATEMENT: Pt indicated having insidious onset of complaints in Lt hip from back to front reported a few days ago.  Reported severe pain.  Indicated having some improvement at times but still experiencing the pain complaints.  Reported it can impact walking/standing.  Stated last visit helped back feel better.    PERTINENT HISTORY: Occlusion and stenosis of bilateral carotid arteries, diabetes, polyneuropathy, chronic kidney disease stage 3a, Benign prostatic hyperplasia with lower urinary tract symptoms, COPD, Benign neoplasm of colon, Cataract, Joint replacement  PAIN:   NPRS scale: today 2-3/10 Pain location: hamstring and calf Pain description: tightness and racheting Aggravating factors: initially standing up  Relieving factors: tylenol and taking a few steps   PRECAUTIONS: Fall  RED FLAGS: None   WEIGHT BEARING RESTRICTIONS: No  FALLS:  Has patient fallen in last 6 months? Yes. Number of falls 2-3x a month, injuries include broken toe, scrap on elbow, black eye  LIVING ENVIRONMENT: Lives with: lives with their spouse who has disabilities  Lives in: [place], 2 story with master bedroom and bathroom downstairs  Stairs: Yes: Internal: 8+8 steps; on right going up and External: 4-5 steps; on right going up Has  following equipment at home: Single point cane, Walker - 2 wheeled, Tour manager, and Grab bars  OCCUPATION: retired   PLOF: Independent  He reports repetitive falls   PATIENT GOALS:  Get rid of R knee pain. Improve strength and balance to decrease fall frequency   NEXT MD VISIT:  tbd   OBJECTIVE:  DIAGNOSTIC FINDINGS:  10/27/2023, Radiographs of his R knee show degenerative changes with no acute fractures noted   Patient-Specific Activity Scoring Scheme  0 represents "unable to perform."  10 represents "able to perform at prior level. 0 1 2 3 4 5 6 7 8 9  10 (Date and Score)   Activity Eval  11/09/23 12/30/2023   1. ADLs 5 6   2. Walking 5 6   3. Prevent falls 5 6  4.    5.    Score 5 6   Total score = sum of the activity scores/number of activities Minimum detectable change (90%CI) for average score = 2 points Minimum detectable change (90%CI) for single activity score = 3 points  COGNITION: Overall cognitive status: WFL    SENSATION: Patient states no sensation in his feet due to diabetes  MUSCLE LENGTH Hamstring (hip position at 90* and knee extending) R: - 38* L: -48*  POSTURE: rounded shoulders and forward head  LOWER EXTREMITY ROM:   ROM Right eval Left eval  Hip flexion    Hip extension    Hip abduction    Hip adduction    Hip internal rotation    Hip external rotation    Knee flexion    Knee extension    Ankle dorsiflexion P: - 6 P: - 8  Ankle plantarflexion    Ankle inversion    Ankle eversion     (Blank rows = not tested)  LOWER EXTREMITY MMT:  MMT Right eval Left eval Right 01/13/2024 Left 01/13/2024  Hip flexion      Hip extension   3+/5 4/5  Hip abduction   5/5 5/5  Hip adduction      Hip internal rotation      Hip external rotation      Knee flexion      Knee extension 5/5 5/5    Ankle dorsiflexion      Ankle plantarflexion      Ankle inversion      Ankle eversion       (Blank rows = not tested)   FUNCTIONAL TESTS:  12/21/2023 TUG: 11.91 seconds   12/08/2023:  Timed Up & Go: 12.13 seconds no AD  Berg Balance 39/56   11/23/2023 TUG: 13.25 sec with CGA Cognitive TUG:14.37 sec with CGA stopped naming during turns Gait Speed (self-selected):  1.29 ft/sec Gait Speed (fast): 1.39 ft/sec  11/09/2023 Berg Balance 21/56  GAIT 01/11/2024: Ambulation independent with variable postural sway and moments of LOB requiring stepping to correct.    Eval:   Gait pattern: excessive arm swing for counterbalance BUEs, step  through pattern, decreased step length- Right, decreased step length- Left, decreased stride length, decreased hip/knee flexion- Right, decreased hip/knee flexion- Left, knee flexed in stance- Right, lateral lean- Left, trunk flexed, and wide BOS Distance walked: 100' x 2 Assistive device utilized: None Level of assistance: SBA / verbal cues needed for gait deviations                  TODAY'S TREATMENT  DATE: 01/18/2024  Therapeutic Exercise:  Recumbent bike seat 8, level 3 for 8 minutes working on range and fluency on movement  Hooklying trunk rotation Supine clamshells, green resistance bands, 2x8 Sidelying abduction 1x8 each side   Neuro Re-Ed Weight shift bwd/fwd and laterally for ankle strategy 2 mins each with SBA and consistent cues.  Step over four hurdles w/ UE regression: bilateral UE, single UE, no UE. Patient able to do step through gait pattern with bilateral and single UE. Step to gait pattern when using no UE due to unsteadiness    TODAY'S TREATMENT                                                                          DATE: 01/13/2024  Therapeutic Exercise:  Recumbent bike seat 8, level 2 for 9 minutes working on range and fluency on movement  Glute bride, 2x10 Supine clamshells, 2x10 with green resistance band  Continued education on importance of interventions to help with all impairments to reduce fall risk.   Neuro Re-Ed [place] with bilateral UE support, 3 laps  Weight shift bwd/fwd ankle strategy 2 mins with SBA and consistent cues.  Step over three hurdles w/ bilateral UE support, 4 laps   MMT measurements taken   TODAY'S TREATMENT                                                                          DATE: 01/11/2024  Therapeutic Exercise:  Recumbent bike seat 8, level 3 for 8 minutes working on knee range and fluency of repetitive motion.  Neuro Re-Ed (working on standing  balance) Reaching blaze pod activity, 4x45 seconds on floor and then on foam. Reaching with single arm independently. Patient had most difficulty with on foam when crossing their arm across the body and reaching the highest blaze pod Standing with feet together w/ eyes open and close, 30 seconds each  Standing in semi-tandem stance w/ eyes open and close, 30 seconds each   TherAct (facilitation of STS and transfers) Step up & step down 6 box BUE support // bars 2x8 reps with ea LE Lateral step up/down to right & to left 2x8 reps each 6 box with BUE support //bars  PATIENT EDUCATION:  Education details: POC Person educated: Patient Education method: Explanation, Verbal cues, handout Education comprehension: verbalized understanding  HOME EXERCISE PROGRAM: Access Code: [email or web address] URL: [email or web address] Date: 01/18/2024 Prepared by: Ismael Theophilus Stallion  Exercises - Gastroc Stretch on Step  - 1-2 x daily - 7 x weekly - 1 sets - 3 reps - 30 seconds hold - Seated Hamstring Stretch with Strap  - 1-2 x daily - 7 x weekly - 1 sets - 3 reps - 30 seconds hold - wide stance head motions eyes open  - 1 x daily - 4 x weekly - 1 sets - 5-10 reps - 2 seconds hold - Standing Balance with Eyes Closed  - 1 x daily - 4  x weekly - 1 sets - 3 reps - 10 seconds hold - Feet Apart with Eyes Closed with Head Motions  - 1 x daily - 4 x weekly - 1 sets - 5-10 reps - 2 seconds hold - Wide stance on Foam Pad head movements  - 1 x daily - 4 x weekly - 1 sets - 5-10 reps - 2 seconds hold - Squat with Chair and Counter Support  - 1 x daily - 4-7 x weekly - 2 sets - 10 reps - 5 seconds hold - sit to stand and stand to sit  - 1 x daily - 4-7 x weekly - 2 sets - 10 reps - 5 seconds hold - [place] with Counter Support  - 1 x daily - 4-7 x weekly - 2 sets - 10 reps - Standing Terminal Knee Extension with Resistance  - 1 x daily - 7 x weekly - 2-3 sets - 10 reps - 3-5 seconds hold - Supine Bridge   - 1-2 x daily - 7 x weekly - 1-2 sets - 10 reps - 2 hold - Hooklying Isometric Clamshell  - 1-2 x daily - 7 x weekly - 2-3 sets - 10-15 reps - Supine Lower Trunk Rotation  - 2-3 x daily - 7 x weekly - 1 sets - 3-5 reps - 15 hold  ASSESSMENT: CLINICAL IMPRESSION: Patient arrived to session with high levels of back/hip pain. Stretches and movement throughout the session helped minimize his pain. Patient continues to have difficulty with balance exercises introduced, especially with fwd/bwd weight shifts as he is not able to self-correct when thrown off balance and needs external support to stop fall.  Patient will continue to benefit from skilled PT to address impairments and minimize falls.  Advised patient to use [facility] for safety. Pt seemed reluctant.   OBJECTIVE IMPAIRMENTS: Abnormal gait, cardiopulmonary status limiting activity, decreased balance, decreased endurance, decreased knowledge of use of DME, decreased mobility, decreased ROM, decreased strength, impaired flexibility, impaired sensation, postural dysfunction, and pain.   ACTIVITY LIMITATIONS: carrying, lifting, standing, stairs, transfers, and locomotion level  PARTICIPATION LIMITATIONS: meal prep, cleaning, laundry, community activity, and yard work  PERSONAL FACTORS: Age, Fitness, Past/current experiences, Time since onset of injury/illness/exacerbation, and 3+ comorbidities: see PMH are also affecting patient's functional outcome.   REHAB POTENTIAL: Good  CLINICAL DECISION MAKING: Evolving/moderate complexity  EVALUATION COMPLEXITY: Moderate   GOALS: Goals reviewed with patient? Yes  SHORT TERM GOALS: target date for Short term goals 12/07/23   1.  Patient will demonstrate independent use of home exercise program to maintain progress from in clinic treatments.  Baseline: See objective data Goal status: MET, 12/08/2023  2. Patient will demonstrate TUG </= 12 seconds without use of UE for turning.  Baseline: See objective  data Goal status: MET,  12/21/2023  3. BERG score >/= 25/56 Baseline: See objective data Goal status: MET, 12/08/2023  LONG TERM GOALS: target dates for all long term goals 02/04/24   1. Patient will demonstrate/report pain at worst less than or equal to 2/10 to facilitate minimal limitation in daily activity secondary to pain symptoms. Baseline: See objective data Goal status: ongoing   01/05/2024   2. Patient will demonstrate independent use of home exercise program to facilitate ability to maintain/progress functional gains from skilled physical therapy services. Baseline: See objective data Goal status: ongoing   01/05/2024  3.  BERG score >/= 30/56 for MDIC  Baseline: SEE OBJECTIVE DATA Goal status: ongoing   01/05/2024  4. Patient will demonstrate TUG </= 10 seconds without use of UE for turning.  Baseline: See objective data Goal status: ongoing   01/05/2024   5.  Patient will report no falls within the last 4 weeks of PT Baseline: See objective data Goal status:ongoing   01/05/2024   6.  Patient will ambulate > 300' with appropriate assistive device with modified independence Baseline: See objective data Goal status: ongoing   01/05/2024   PLAN:  PT FREQUENCY:  2x/week  PT DURATION: 12 weeks  PLANNED INTERVENTIONS: 97164- PT Re-evaluation, 97750- Physical Performance Testing, 97110-Therapeutic exercises, 97530- Therapeutic activity, 97112- Neuromuscular re-education, [id number]- Self Care, [id number]- Gait training, Patient/Family education, Balance training, Stair training, and DME instructions  PLAN FOR NEXT SESSION:  check in hip/back pain, turning balance, challenge stepping strategy   Ismael Nap, Student-PT 01/18/2024,  1:42 PM

## 2024-01-19 ENCOUNTER — Encounter: Payer: Self-pay | Admitting: Podiatry

## 2024-01-19 ENCOUNTER — Ambulatory Visit (INDEPENDENT_AMBULATORY_CARE_PROVIDER_SITE_OTHER): Admitting: Podiatry

## 2024-01-19 DIAGNOSIS — E119 Type 2 diabetes mellitus without complications: Secondary | ICD-10-CM

## 2024-01-19 DIAGNOSIS — B351 Tinea unguium: Secondary | ICD-10-CM | POA: Diagnosis not present

## 2024-01-19 DIAGNOSIS — N1831 Chronic kidney disease, stage 3a: Secondary | ICD-10-CM | POA: Diagnosis not present

## 2024-01-19 DIAGNOSIS — M79674 Pain in right toe(s): Secondary | ICD-10-CM | POA: Diagnosis not present

## 2024-01-19 DIAGNOSIS — M79675 Pain in left toe(s): Secondary | ICD-10-CM

## 2024-01-19 NOTE — Progress Notes (Signed)
 This patient returns to my office for at risk foot care.  This patient requires this care by a professional since this patient will be at risk due to having diabetes.  This patient is unable to cut nails  himself since the patient cannot reach his nails.These nails are painful walking and wearing shoes.  He has history of injuring his right big toe two months ago.This patient presents for at risk foot care today.  General Appearance  Alert, conversant and in no acute stress.  Vascular  Dorsalis pedis and posterior tibial  pulses are palpable  bilaterally.  Capillary return is within normal limits  bilaterally. Temperature is within normal limits  bilaterally.  Neurologic  Senn-Weinstein monofilament wire test diminished   bilaterally. Muscle power within normal limits bilaterally.  Nails Thick disfigured discolored nails with subungual debris  from hallux to fifth toes bilaterally. No evidence of bacterial infection or drainage bilaterally.  Orthopedic  No limitations of motion  feet .  No crepitus or effusions noted.  No bony pathology or digital deformities noted.  HAV  B/L.  Hammer toes 2-5  B/l. Limited ROM IPJ right hallux  Skin  normotropic skin with no porokeratosis noted bilaterally.  No signs of infections or ulcers noted.  Callus sub 1st MPJ  and sub 3 right foot symptomatic  Onychomycosis  Pain in right toes  Pain in left toes    Consent was obtained for treatment procedures.   Mechanical debridement of nails 1-5  bilaterally performed with a nail nipper.  Filed with dremel without incident.   Return office visit    3 months                 Told patient to return for periodic foot care and evaluation due to potential at risk complications.   Ruffin Cotton DPM

## 2024-01-20 ENCOUNTER — Encounter: Payer: Self-pay | Admitting: Rehabilitative and Restorative Service Providers"

## 2024-01-20 ENCOUNTER — Ambulatory Visit (INDEPENDENT_AMBULATORY_CARE_PROVIDER_SITE_OTHER): Admitting: Rehabilitative and Restorative Service Providers"

## 2024-01-20 DIAGNOSIS — R2689 Other abnormalities of gait and mobility: Secondary | ICD-10-CM | POA: Diagnosis not present

## 2024-01-20 DIAGNOSIS — R2681 Unsteadiness on feet: Secondary | ICD-10-CM | POA: Diagnosis not present

## 2024-01-20 DIAGNOSIS — R296 Repeated falls: Secondary | ICD-10-CM | POA: Diagnosis not present

## 2024-01-20 DIAGNOSIS — G8929 Other chronic pain: Secondary | ICD-10-CM

## 2024-01-20 DIAGNOSIS — M25561 Pain in right knee: Secondary | ICD-10-CM

## 2024-01-20 DIAGNOSIS — M6281 Muscle weakness (generalized): Secondary | ICD-10-CM

## 2024-01-20 DIAGNOSIS — R29898 Other symptoms and signs involving the musculoskeletal system: Secondary | ICD-10-CM

## 2024-01-20 NOTE — Therapy (Cosign Needed)
 OUTPATIENT PHYSICAL THERAPY TREATMENT   Patient Name: Gabriel Evans MRN: [id number] DOB:01/12/1941, 83 y.o., male Today's Date: 01/20/2024     END OF SESSION:  PT End of Session - 01/20/24 1234     Visit Number 19    Number of Visits 25    Date for PT Re-Evaluation 02/04/24    Authorization Type MEDICARE AND [employer]    Progress Note Due on Visit 20    PT Start Time 1143    PT Stop Time 1234    PT Time Calculation (min) 51 min    Equipment Utilized During Treatment Gait belt    Activity Tolerance Patient tolerated treatment well    Behavior During Therapy WFL for tasks assessed/performed               Past Medical History:  Diagnosis Date   Allergy    Cataract    Diabetes mellitus without complication (HCC)    Thyroid  disease    Past Surgical History:  Procedure Laterality Date   EYE SURGERY     FRACTURE SURGERY     JOINT REPLACEMENT     VASECTOMY     Patient Active Problem List   Diagnosis Date Noted   Pain in right elbow 05/29/2022   Pain in right knee 05/29/2022   Occlusion and stenosis of bilateral carotid arteries 06/26/2021   Diabetes (HCC) 03/19/2021   Skin lesion 03/19/2021   Callus 02/28/2020   Chronic kidney disease, stage 3a (HCC) 04/07/2019   Insomnia 04/07/2019   Benign prostatic hyperplasia with lower urinary tract symptoms 12/03/2017   Chronic obstructive pulmonary disease (HCC) 04/29/2017   Abnormal gait 02/03/2017   Anxiety disorder 02/03/2017   Benign neoplasm of colon 02/03/2017   Hyperlipidemia 02/03/2017   Polyneuropathy due to type 2 diabetes mellitus (HCC) 02/03/2017   Postoperative hypothyroidism 02/03/2017    PCP: Nichole Senior, MD  REFERRING PROVIDER: Persons, Ronal Dragon, PA   REFERRING DIAG: [phone] (ICD-10-CM) - Chronic pain of right knee   THERAPY DIAG:  Unsteadiness on feet  Repeated falls  Muscle weakness (generalized)  Other abnormalities of gait and mobility  Other symptoms and signs involving the  musculoskeletal system  Chronic pain of right knee  Rationale for Evaluation and Treatment: Rehabilitation  ONSET DATE: 10/27/23 PA referral to PT  SUBJECTIVE:   SUBJECTIVE STATEMENT: Patient reports that hip pain is better but he does continue to have pain there.  Patient reports knee pain has come back.   PERTINENT HISTORY: Occlusion and stenosis of bilateral carotid arteries, diabetes, polyneuropathy, chronic kidney disease stage 3a, Benign prostatic hyperplasia with lower urinary tract symptoms, COPD, Benign neoplasm of colon, Cataract, Joint replacement  PAIN:   NPRS scale: today 2-3/10 Pain location: hamstring and calf Pain description: tightness and racheting Aggravating factors: initially standing up  Relieving factors: tylenol and taking a few steps   PRECAUTIONS: Fall  RED FLAGS: None   WEIGHT BEARING RESTRICTIONS: No  FALLS:  Has patient fallen in last 6 months? Yes. Number of falls 2-3x a month, injuries include broken toe, scrap on elbow, black eye  LIVING ENVIRONMENT: Lives with: lives with their spouse who has disabilities  Lives in: [place], 2 story with master bedroom and bathroom downstairs  Stairs: Yes: Internal: 8+8 steps; on right going up and External: 4-5 steps; on right going up Has following equipment at home: Single point cane, Environmental consultant - 2 wheeled, Tour manager, and Grab bars  OCCUPATION: retired   PLOF: Independent  He reports repetitive  falls   PATIENT GOALS:  Get rid of R knee pain. Improve strength and balance to decrease fall frequency   NEXT MD VISIT:  tbd   OBJECTIVE:  DIAGNOSTIC FINDINGS:  10/27/2023, Radiographs of his R knee show degenerative changes with no acute fractures noted   Patient-Specific Activity Scoring Scheme  0 represents "unable to perform." 10 represents "able to perform at prior level. 0 1 2 3 4 5 6 7 8 9  10 (Date and Score)   Activity Eval  11/09/23 12/30/2023   1. ADLs 5 6   2. Walking 5 6   3. Prevent  falls 5 6  4.    5.    Score 5 6   Total score = sum of the activity scores/number of activities Minimum detectable change (90%CI) for average score = 2 points Minimum detectable change (90%CI) for single activity score = 3 points  COGNITION: Overall cognitive status: WFL    SENSATION: Patient states no sensation in his feet due to diabetes  MUSCLE LENGTH Hamstring (hip position at 90* and knee extending) R: - 38* L: -48*  POSTURE: rounded shoulders and forward head  LOWER EXTREMITY ROM:   ROM Right eval Left eval  Hip flexion    Hip extension    Hip abduction    Hip adduction    Hip internal rotation    Hip external rotation    Knee flexion    Knee extension    Ankle dorsiflexion P: - 6 P: - 8  Ankle plantarflexion    Ankle inversion    Ankle eversion     (Blank rows = not tested)  LOWER EXTREMITY MMT:  MMT Right eval Left eval Right 01/13/2024 Left 01/13/2024  Hip flexion      Hip extension   3+/5 4/5  Hip abduction   5/5 5/5  Hip adduction      Hip internal rotation      Hip external rotation      Knee flexion      Knee extension 5/5 5/5    Ankle dorsiflexion      Ankle plantarflexion      Ankle inversion      Ankle eversion       (Blank rows = not tested)   FUNCTIONAL TESTS:  12/21/2023 TUG: 11.91 seconds   12/08/2023:  Timed Up & Go: 12.13 seconds no AD  Berg Balance 39/56   11/23/2023 TUG: 13.25 sec with CGA Cognitive TUG:14.37 sec with CGA stopped naming during turns Gait Speed (self-selected):  1.29 ft/sec Gait Speed (fast): 1.39 ft/sec  11/09/2023 Berg Balance 21/56  GAIT 01/11/2024: Ambulation independent with variable postural sway and moments of LOB requiring stepping to correct.    Eval:   Gait pattern: excessive arm swing for counterbalance BUEs, step through pattern, decreased step length- Right, decreased step length- Left, decreased stride length, decreased hip/knee flexion- Right, decreased hip/knee flexion- Left, knee flexed  in stance- Right, lateral lean- Left, trunk flexed, and wide BOS Distance walked: 100' x 2 Assistive device utilized: None Level of assistance: SBA / verbal cues needed for gait deviations                  TODAY'S TREATMENT  DATE: 01/20/2024  Therapeutic Exercise:  Recumbent bike seat 8, level 3 for 8 minutes working on range and fluency on movement   Gait Training PT verbally educated patient on importance of using SPC during ambulation to help with balance to prevent falls.  Patient verbalized understanding PT demoed how to use SPC during ambulation.  Patient verbalized and demo understanding.  Patient ambulated 120 with cane and supervision while focused on gait sequence PT verbally educated patient to stop walking when turning to talk to people because it causes him to lose balance.  Patient verbalized understanding Patient ambulated 60 with cane and supervision  Neuro Re-Ed Weight shift bwd/fwd ankle strategy 2 mins with SBA and consistent cues.  Looking back over shoulders in both directions while standing in // bars Standing with feet shoulder width apart on the floor, scanning in the following directions: Lateral, up/down, and diagonal in both directions.  In // bars for UE support. Occasional HHA to re-stabilize Standing with feet shoulder width apart on the foam, scanning in the following directions: Lateral, up/down, and diagonal in both directions.  In // bars for UE support.  Patient had the most difficulty with looking diagonally and in an upward direction and needed HHA to re-stabilize   TODAY'S TREATMENT                                                                          DATE: 01/18/2024  Therapeutic Exercise:  Recumbent bike seat 8, level 3 for 8 minutes working on range and fluency on movement  Hooklying trunk rotation Supine clamshells, green resistance bands, 2x8 Sidelying abduction 1x8 each  side   Neuro Re-Ed Weight shift bwd/fwd and laterally for ankle strategy 2 mins each with SBA and consistent cues.  Step over four hurdles w/ UE regression: bilateral UE, single UE, no UE. Patient able to do step through gait pattern with bilateral and single UE. Step to gait pattern when using no UE due to unsteadiness    TODAY'S TREATMENT                                                                          DATE: 01/13/2024  Therapeutic Exercise:  Recumbent bike seat 8, level 2 for 9 minutes working on range and fluency on movement  Glute bride, 2x10 Supine clamshells, 2x10 with green resistance band  Continued education on importance of interventions to help with all impairments to reduce fall risk.   Neuro Re-Ed [place] with bilateral UE support, 3 laps  Weight shift bwd/fwd ankle strategy 2 mins with SBA and consistent cues.  Step over three hurdles w/ bilateral UE support, 4 laps   MMT measurements taken    PATIENT EDUCATION:  Education details: POC Person educated: Patient Education method: Explanation, Verbal cues, handout Education comprehension: verbalized understanding  HOME EXERCISE PROGRAM: Access Code: [email or web address] URL: [email or web address] Date: 01/18/2024 Prepared by: Ismael Theophilus Stallion  Exercises - Gastroc Stretch on Step  -  1-2 x daily - 7 x weekly - 1 sets - 3 reps - 30 seconds hold - Seated Hamstring Stretch with Strap  - 1-2 x daily - 7 x weekly - 1 sets - 3 reps - 30 seconds hold - wide stance head motions eyes open  - 1 x daily - 4 x weekly - 1 sets - 5-10 reps - 2 seconds hold - Standing Balance with Eyes Closed  - 1 x daily - 4 x weekly - 1 sets - 3 reps - 10 seconds hold - Feet Apart with Eyes Closed with Head Motions  - 1 x daily - 4 x weekly - 1 sets - 5-10 reps - 2 seconds hold - Wide stance on Foam Pad head movements  - 1 x daily - 4 x weekly - 1 sets - 5-10 reps - 2 seconds hold - Squat with Chair and Counter Support  - 1  x daily - 4-7 x weekly - 2 sets - 10 reps - 5 seconds hold - sit to stand and stand to sit  - 1 x daily - 4-7 x weekly - 2 sets - 10 reps - 5 seconds hold - [place] with Counter Support  - 1 x daily - 4-7 x weekly - 2 sets - 10 reps - Standing Terminal Knee Extension with Resistance  - 1 x daily - 7 x weekly - 2-3 sets - 10 reps - 3-5 seconds hold - Supine Bridge  - 1-2 x daily - 7 x weekly - 1-2 sets - 10 reps - 2 hold - Hooklying Isometric Clamshell  - 1-2 x daily - 7 x weekly - 2-3 sets - 10-15 reps - Supine Lower Trunk Rotation  - 2-3 x daily - 7 x weekly - 1 sets - 3-5 reps - 15 hold  ASSESSMENT: CLINICAL IMPRESSION: Patient was instructed on how and when to use SPC to minimize falls.  Patient seemed to understand and convinced to use at home and in the community as needed.  Patient continues to have balance difficulties when turning head during ambulation.  Using the cane in the clinic helped avoid major LOB when turning his head.  Patient additionally continues to have difficulty with balance with scanning in different directions while standing. Patient will continue to benefit from skilled PT to address impairments and minimize falls.   OBJECTIVE IMPAIRMENTS: Abnormal gait, cardiopulmonary status limiting activity, decreased balance, decreased endurance, decreased knowledge of use of DME, decreased mobility, decreased ROM, decreased strength, impaired flexibility, impaired sensation, postural dysfunction, and pain.   ACTIVITY LIMITATIONS: carrying, lifting, standing, stairs, transfers, and locomotion level  PARTICIPATION LIMITATIONS: meal prep, cleaning, laundry, community activity, and yard work  PERSONAL FACTORS: Age, Fitness, Past/current experiences, Time since onset of injury/illness/exacerbation, and 3+ comorbidities: see PMH are also affecting patient's functional outcome.   REHAB POTENTIAL: Good  CLINICAL DECISION MAKING: Evolving/moderate complexity  EVALUATION COMPLEXITY:  Moderate   GOALS: Goals reviewed with patient? Yes  SHORT TERM GOALS: target date for Short term goals 12/07/23   1.  Patient will demonstrate independent use of home exercise program to maintain progress from in clinic treatments.  Baseline: See objective data Goal status: MET, 12/08/2023  2. Patient will demonstrate TUG </= 12 seconds without use of UE for turning.  Baseline: See objective data Goal status: MET,  12/21/2023  3. BERG score >/= 25/56 Baseline: See objective data Goal status: MET, 12/08/2023  LONG TERM GOALS: target dates for all long term goals 02/04/24  1. Patient will demonstrate/report pain at worst less than or equal to 2/10 to facilitate minimal limitation in daily activity secondary to pain symptoms. Baseline: See objective data Goal status: ongoing   01/05/2024   2. Patient will demonstrate independent use of home exercise program to facilitate ability to maintain/progress functional gains from skilled physical therapy services. Baseline: See objective data Goal status: ongoing   01/05/2024  3.  BERG score >/= 30/56 for [facility]IC  Baseline: SEE OBJECTIVE DATA Goal status: ongoing   01/05/2024   4. Patient will demonstrate TUG </= 10 seconds without use of UE for turning.  Baseline: See objective data Goal status: ongoing   01/05/2024   5.  Patient will report no falls within the last 4 weeks of PT Baseline: See objective data Goal status:ongoing   01/05/2024   6.  Patient will ambulate > 300' with appropriate assistive device with modified independence Baseline: See objective data Goal status: ongoing   01/05/2024   PLAN:  PT FREQUENCY:  2x/week  PT DURATION: 12 weeks  PLANNED INTERVENTIONS: 97164- PT Re-evaluation, 97750- Physical Performance Testing, 97110-Therapeutic exercises, 97530- Therapeutic activity, 97112- Neuromuscular re-education, [phone]- Self Care, [id number]- Gait training, Patient/Family education, Balance training, Stair training, and DME  instructions  PLAN FOR NEXT SESSION: Check in if patient decided to use cane for home and community, challenge balance with scanning in different directions, challenge stepping strategy    Ismael Nap, Student-PT 01/20/2024,  4:38 PM

## 2024-01-26 ENCOUNTER — Encounter: Payer: Self-pay | Admitting: Physical Therapy

## 2024-01-26 ENCOUNTER — Ambulatory Visit (INDEPENDENT_AMBULATORY_CARE_PROVIDER_SITE_OTHER): Admitting: Physical Therapy

## 2024-01-26 DIAGNOSIS — M25561 Pain in right knee: Secondary | ICD-10-CM

## 2024-01-26 DIAGNOSIS — R296 Repeated falls: Secondary | ICD-10-CM

## 2024-01-26 DIAGNOSIS — M6281 Muscle weakness (generalized): Secondary | ICD-10-CM | POA: Diagnosis not present

## 2024-01-26 DIAGNOSIS — G8929 Other chronic pain: Secondary | ICD-10-CM

## 2024-01-26 DIAGNOSIS — R29898 Other symptoms and signs involving the musculoskeletal system: Secondary | ICD-10-CM

## 2024-01-26 DIAGNOSIS — R2689 Other abnormalities of gait and mobility: Secondary | ICD-10-CM | POA: Diagnosis not present

## 2024-01-26 DIAGNOSIS — R2681 Unsteadiness on feet: Secondary | ICD-10-CM | POA: Diagnosis not present

## 2024-01-26 NOTE — Therapy (Signed)
 OUTPATIENT PHYSICAL THERAPY TREATMENT & PROGRESS NOTE  Patient Name: Gabriel Evans MRN: [id number] DOB:1940/08/07, 83 y.o., male Today's Date: 01/26/2024   Progress Note Reporting Period 12/21/2023 to 01/26/2024  See note below for Objective Data and Assessment of Progress/Goals.    END OF SESSION:  PT End of Session - 01/26/24 1147     Visit Number 20    Number of Visits 25    Date for Recertification  02/04/24    Authorization Type MEDICARE AND [employer]    PT Start Time 1147    PT Stop Time 1229    PT Time Calculation (min) 42 min    Activity Tolerance Patient tolerated treatment well    Behavior During Therapy WFL for tasks assessed/performed                Past Medical History:  Diagnosis Date   Allergy    Cataract    Diabetes mellitus without complication (HCC)    Thyroid  disease    Past Surgical History:  Procedure Laterality Date   EYE SURGERY     FRACTURE SURGERY     JOINT REPLACEMENT     VASECTOMY     Patient Active Problem List   Diagnosis Date Noted   Pain in right elbow 05/29/2022   Pain in right knee 05/29/2022   Occlusion and stenosis of bilateral carotid arteries 06/26/2021   Diabetes (HCC) 03/19/2021   Skin lesion 03/19/2021   Callus 02/28/2020   Chronic kidney disease, stage 3a (HCC) 04/07/2019   Insomnia 04/07/2019   Benign prostatic hyperplasia with lower urinary tract symptoms 12/03/2017   Chronic obstructive pulmonary disease (HCC) 04/29/2017   Abnormal gait 02/03/2017   Anxiety disorder 02/03/2017   Benign neoplasm of colon 02/03/2017   Hyperlipidemia 02/03/2017   Polyneuropathy due to type 2 diabetes mellitus (HCC) 02/03/2017   Postoperative hypothyroidism 02/03/2017    PCP: Nichole Senior, MD  REFERRING PROVIDER: Persons, Ronal Dragon, PA   REFERRING DIAG: [phone] (ICD-10-CM) - Chronic pain of right knee   THERAPY DIAG:  Unsteadiness on feet  Repeated falls  Muscle weakness (generalized)  Other abnormalities of  gait and mobility  Other symptoms and signs involving the musculoskeletal system  Chronic pain of right knee  Rationale for Evaluation and Treatment: Rehabilitation  ONSET DATE: 10/27/23 PA referral to PT  SUBJECTIVE:   SUBJECTIVE STATEMENT:   He has been using cane and less balance issues.  He thinks it makes him aware of his walking.     PERTINENT HISTORY: Occlusion and stenosis of bilateral carotid arteries, diabetes, polyneuropathy, chronic kidney disease stage 3a, Benign prostatic hyperplasia with lower urinary tract symptoms, COPD, Benign neoplasm of colon, Cataract, Joint replacement  PAIN:   NPRS scale: today 2/10 Pain location: hamstring and calf Pain description: tightness and racheting Aggravating factors: initially standing up  Relieving factors: tylenol and taking a few steps   PRECAUTIONS: Fall  RED FLAGS: None   WEIGHT BEARING RESTRICTIONS: No  FALLS:  Has patient fallen in last 6 months? Yes. Number of falls 2-3x a month, injuries include broken toe, scrap on elbow, black eye  LIVING ENVIRONMENT: Lives with: lives with their spouse who has disabilities  Lives in: [place], 2 story with master bedroom and bathroom downstairs  Stairs: Yes: Internal: 8+8 steps; on right going up and External: 4-5 steps; on right going up Has following equipment at home: Single point cane, Environmental consultant - 2 wheeled, Tour manager, and Grab bars  OCCUPATION: retired   PLOF: Independent  He reports repetitive falls   PATIENT GOALS:  Get rid of R knee pain. Improve strength and balance to decrease fall frequency   NEXT MD VISIT:  tbd   OBJECTIVE:  DIAGNOSTIC FINDINGS:  10/27/2023, Radiographs of his R knee show degenerative changes with no acute fractures noted   Patient-Specific Activity Scoring Scheme  0 represents "unable to perform." 10 represents "able to perform at prior level. 0 1 2 3 4 5 6 7 8 9  10 (Date and Score)   Activity Eval  11/09/23 12/30/2023   1. ADLs 5 6    2. Walking 5 6   3. Prevent falls 5 6  4.    5.    Score 5 6   Total score = sum of the activity scores/number of activities Minimum detectable change (90%CI) for average score = 2 points Minimum detectable change (90%CI) for single activity score = 3 points  COGNITION: Overall cognitive status: WFL    SENSATION: Patient states no sensation in his feet due to diabetes  MUSCLE LENGTH Hamstring (hip position at 90* and knee extending) R: - 38* L: -48*  POSTURE: rounded shoulders and forward head  LOWER EXTREMITY ROM:   ROM Right eval Left eval  Hip flexion    Hip extension    Hip abduction    Hip adduction    Hip internal rotation    Hip external rotation    Knee flexion    Knee extension    Ankle dorsiflexion P: - 6 P: - 8  Ankle plantarflexion    Ankle inversion    Ankle eversion     (Blank rows = not tested)  LOWER EXTREMITY MMT:  MMT Right eval Left eval Right 01/13/2024 Left 01/13/2024  Hip flexion      Hip extension   3+/5 4/5  Hip abduction   5/5 5/5  Hip adduction      Hip internal rotation      Hip external rotation      Knee flexion      Knee extension 5/5 5/5    Ankle dorsiflexion      Ankle plantarflexion      Ankle inversion      Ankle eversion       (Blank rows = not tested)   FUNCTIONAL TESTS:  01/26/2024: 5x Sit to/from stand using 18 chair without armrests with BUEs on seat: 35.38 sec.  12/21/2023 TUG: 11.91 seconds   12/08/2023:  Timed Up & Go: 12.13 seconds no AD  Berg Balance 39/56   11/23/2023 TUG: 13.25 sec with CGA Cognitive TUG:14.37 sec with CGA stopped naming during turns Gait Speed (self-selected):  1.29 ft/sec Gait Speed (fast): 1.39 ft/sec  11/09/2023 Berg Balance 21/56  GAIT 01/26/2024:  Gait Velocity:  without device 2.89 ft/sec;  with single point cane std tip 3.23 ft/sec;  with single point cane stand alone tip 3.18 ft/sec  01/21/2024: Ambulation with SPC in Rt UE showed improved stability.  After  education, sequencing was consistently performed well.  Head turns and distractions definitely impact stability in ambulation resulting in lateral or posterior weight displacement.   01/11/2024: Ambulation independent with variable postural sway and moments of LOB requiring stepping to correct.    Eval:   Gait pattern: excessive arm swing for counterbalance BUEs, step through pattern, decreased step length- Right, decreased step length- Left, decreased stride length, decreased hip/knee flexion- Right, decreased hip/knee flexion- Left, knee flexed in stance- Right, lateral lean- Left, trunk flexed, and wide BOS Distance  walked: 100' x 2 Assistive device utilized: None Level of assistance: SBA / verbal cues needed for gait deviations                  TODAY'S TREATMENT                                                                          DATE: 01/26/2024  Therapeutic Exercise:  Recumbent bike seat 8, level 4 for 8 minutes working on range and fluency on movement  PT instructed patient and recommendation for ongoing HEP to maintain flexibility, strength and muscle endurance over time.  PT explained that performing exercises for short period of time and then stopping limits the functional outcomes or benefits.  Patient verbalized understanding.  Therapeutic Activities: PT demo & verbal cues on sit to/from stand technique with focus on weight over feet for sit down and standing up.  Pt return demo understanding.   See objective for 5x STS.    Gait Training PT educated patient with verbal cues and Internet for pictures of base of support and how use of a cane increases the size of his base of support.  We maintain balance by keeping our center of mass over base and with a larger base it is easier to maintain your balance.  Patient verbalized understanding. PT educated patient on different designs of canes with recommendation to avoid any folding canes that will twist within the frame.  PT is  recommending ideally an offset handle cane.  Patient trialed ambulation with a standard tip and with a stand-alone tip.  He prefers the standard tip and PT did not note significant difference. See objective data for gait velocity without a device and with cane.  Patient feels like he is actually slower with a cane but his gait velocity is actually slightly increased. Patient ambulated 150 feet x 2 with cane with supervision.  Patient has greatest difficulty when turning.       TREATMENT                                                                          DATE: 01/20/2024  Therapeutic Exercise:  Recumbent bike seat 8, level 3 for 8 minutes working on range and fluency on movement   Gait Training PT verbally educated patient on importance of using SPC during ambulation to help with balance to prevent falls.  Patient verbalized understanding PT demoed how to use SPC during ambulation.  Patient verbalized and demo understanding.  Patient ambulated 120 with cane and supervision while focused on gait sequence PT verbally educated patient to stop walking then  turning to talk to people because it causes him to lose balance.  Patient verbalized understanding but immediately repeated walking with head turning.  Patient ambulated 60 with cane and supervision Extended time required for verbal discussion about merits of SPC use and stability improvements as a result.   Neuro Re-Ed Weight shift bwd/fwd ankle strategy 2  mins with SBA and consistent cues.  Looking back over shoulders in both directions while standing in // bars Standing with feet shoulder width apart on the floor, scanning in the following directions: Lateral, up/down, and diagonal in both directions.  In // bars for UE support. Occasional HHA to re-stabilize Standing with feet shoulder width apart on the foam, scanning in the following directions: Lateral, up/down, and diagonal in both directions.  In // bars for UE support.  Patient  had the most difficulty with looking diagonally and in an upward direction and needed HHA to re-stabilize   TREATMENT                                                                          DATE: 01/18/2024  Therapeutic Exercise:  Recumbent bike seat 8, level 3 for 8 minutes working on range and fluency on movement  Hooklying trunk rotation Supine clamshells, green resistance bands, 2x8 Sidelying abduction 1x8 each side   Neuro Re-Ed Weight shift bwd/fwd and laterally for ankle strategy 2 mins each with SBA and consistent cues.  Step over four hurdles w/ UE regression: bilateral UE, single UE, no UE. Patient able to do step through gait pattern with bilateral and single UE. Step to gait pattern when using no UE due to unsteadiness     PATIENT EDUCATION:  Education details: POC Person educated: Patient Education method: Explanation, Verbal cues, handout Education comprehension: verbalized understanding  HOME EXERCISE PROGRAM: Access Code: [email or web address] URL: [email or web address] Date: 01/18/2024 Prepared by: Ismael Theophilus Stallion  Exercises - Gastroc Stretch on Step  - 1-2 x daily - 7 x weekly - 1 sets - 3 reps - 30 seconds hold - Seated Hamstring Stretch with Strap  - 1-2 x daily - 7 x weekly - 1 sets - 3 reps - 30 seconds hold - wide stance head motions eyes open  - 1 x daily - 4 x weekly - 1 sets - 5-10 reps - 2 seconds hold - Standing Balance with Eyes Closed  - 1 x daily - 4 x weekly - 1 sets - 3 reps - 10 seconds hold - Feet Apart with Eyes Closed with Head Motions  - 1 x daily - 4 x weekly - 1 sets - 5-10 reps - 2 seconds hold - Wide stance on Foam Pad head movements  - 1 x daily - 4 x weekly - 1 sets - 5-10 reps - 2 seconds hold - Squat with Chair and Counter Support  - 1 x daily - 4-7 x weekly - 2 sets - 10 reps - 5 seconds hold - sit to stand and stand to sit  - 1 x daily - 4-7 x weekly - 2 sets - 10 reps - 5 seconds hold - Carioca with Counter Support  -  1 x daily - 4-7 x weekly - 2 sets - 10 reps - Standing Terminal Knee Extension with Resistance  - 1 x daily - 7 x weekly - 2-3 sets - 10 reps - 3-5 seconds hold - Supine Bridge  - 1-2 x daily - 7 x weekly - 1-2 sets - 10 reps - 2 hold - Hooklying Isometric Clamshell  - 1-2 x daily - 7 x weekly -  2-3 sets - 10-15 reps - Supine Lower Trunk Rotation  - 2-3 x daily - 7 x weekly - 1 sets - 3-5 reps - 15 hold  ASSESSMENT: CLINICAL IMPRESSION: Patient has made progress with PT.  His gait velocity has improved indicating functional change.  PT is recommending that pt use a cane for gait especially outside his home including his yard and in community.  Patient will continue to benefit from skilled PT to address impairments and minimize falls.   OBJECTIVE IMPAIRMENTS: Abnormal gait, cardiopulmonary status limiting activity, decreased balance, decreased endurance, decreased knowledge of use of DME, decreased mobility, decreased ROM, decreased strength, impaired flexibility, impaired sensation, postural dysfunction, and pain.   ACTIVITY LIMITATIONS: carrying, lifting, standing, stairs, transfers, and locomotion level  PARTICIPATION LIMITATIONS: meal prep, cleaning, laundry, community activity, and yard work  PERSONAL FACTORS: Age, Fitness, Past/current experiences, Time since onset of injury/illness/exacerbation, and 3+ comorbidities: see PMH are also affecting patient's functional outcome.   REHAB POTENTIAL: Good  CLINICAL DECISION MAKING: Evolving/moderate complexity  EVALUATION COMPLEXITY: Moderate   GOALS: Goals reviewed with patient? Yes  SHORT TERM GOALS: target date for Short term goals 12/07/23   1.  Patient will demonstrate independent use of home exercise program to maintain progress from in clinic treatments.  Baseline: See objective data Goal status: MET, 12/08/2023  2. Patient will demonstrate TUG </= 12 seconds without use of UE for turning.  Baseline: See objective data Goal  status: MET,  12/21/2023  3. BERG score >/= 25/56 Baseline: See objective data Goal status: MET, 12/08/2023  LONG TERM GOALS: target dates for all long term goals 02/04/24   1. Patient will demonstrate/report pain at worst less than or equal to 2/10 to facilitate minimal limitation in daily activity secondary to pain symptoms. Baseline: See objective data Goal status: ongoing   01/26/2024   2. Patient will demonstrate independent use of home exercise program to facilitate ability to maintain/progress functional gains from skilled physical therapy services. Baseline: See objective data Goal status: ongoing   01/26/2024  3.  BERG score >/= 30/56 for MDIC  Baseline: SEE OBJECTIVE DATA Goal status: ongoing   01/26/2024   4. Patient will demonstrate TUG </= 10 seconds without use of UE for turning.  Baseline: See objective data Goal status: ongoing   01/26/2024   5.  Patient will report no falls within the last 4 weeks of PT Baseline: See objective data Goal status:ongoing   01/26/2024   6.  Patient will ambulate > 300' with appropriate assistive device with modified independence Baseline: See objective data Goal status: ongoing   01/26/2024   PLAN:  PT FREQUENCY:  2x/week  PT DURATION: 12 weeks  PLANNED INTERVENTIONS: 97164- PT Re-evaluation, 97750- Physical Performance Testing, 97110-Therapeutic exercises, 97530- Therapeutic activity, 97112- Neuromuscular re-education, [phone]- Self Care, [id number]- Gait training, Patient/Family education, Balance training, Stair training, and DME instructions  PLAN FOR NEXT SESSION: continue to recommend to use of cane for community or walking in his yard (reports his man cave is in back yard), progress to include functional gait activities, challenge balance with scanning in different directions     Grayce Spatz, PT, DPT 01/26/2024, 4:14 PM

## 2024-01-27 NOTE — Therapy (Signed)
 OUTPATIENT PHYSICAL THERAPY TREATMENT   Patient Name: Gabriel Evans MRN: [id number] DOB:1940-09-13, 83 y.o., male Today's Date: 01/28/2024    END OF SESSION:  PT End of Session - 01/28/24 1139     Visit Number 21    Number of Visits 25    Date for Recertification  02/04/24    Authorization Type MEDICARE AND [employer]    PT Start Time 1148    PT Stop Time 1230    PT Time Calculation (min) 42 min    Activity Tolerance Patient tolerated treatment well    Behavior During Therapy WFL for tasks assessed/performed            Past Medical History:  Diagnosis Date   Allergy    Cataract    Diabetes mellitus without complication (HCC)    Thyroid  disease    Past Surgical History:  Procedure Laterality Date   EYE SURGERY     FRACTURE SURGERY     JOINT REPLACEMENT     VASECTOMY     Patient Active Problem List   Diagnosis Date Noted   Pain in right elbow 05/29/2022   Pain in right knee 05/29/2022   Occlusion and stenosis of bilateral carotid arteries 06/26/2021   Diabetes (HCC) 03/19/2021   Skin lesion 03/19/2021   Callus 02/28/2020   Chronic kidney disease, stage 3a (HCC) 04/07/2019   Insomnia 04/07/2019   Benign prostatic hyperplasia with lower urinary tract symptoms 12/03/2017   Chronic obstructive pulmonary disease (HCC) 04/29/2017   Abnormal gait 02/03/2017   Anxiety disorder 02/03/2017   Benign neoplasm of colon 02/03/2017   Hyperlipidemia 02/03/2017   Polyneuropathy due to type 2 diabetes mellitus (HCC) 02/03/2017   Postoperative hypothyroidism 02/03/2017    PCP: Nichole Senior, MD  REFERRING PROVIDER: Persons, Ronal Dragon, [place]   REFERRING DIAG: [phone] (ICD-10-CM) - Chronic pain of right knee   THERAPY DIAG:  Unsteadiness on feet  Repeated falls  Muscle weakness (generalized)  Other abnormalities of gait and mobility  Rationale for Evaluation and Treatment: Rehabilitation  ONSET DATE: 10/27/23 PA referral to PT  SUBJECTIVE:   SUBJECTIVE  STATEMENT:    Patient reports still trying to find the appropriate/comfortable cane for use.   PERTINENT HISTORY: Occlusion and stenosis of bilateral carotid arteries, diabetes, polyneuropathy, chronic kidney disease stage 3a, Benign prostatic hyperplasia with lower urinary tract symptoms, COPD, Benign neoplasm of colon, Cataract, Joint replacement  PAIN:   NPRS scale: today 2/10 Pain location: hamstring and calf Pain description: tightness and racheting Aggravating factors: initially standing up  Relieving factors: tylenol and taking a few steps   PRECAUTIONS: Fall  RED FLAGS: None   WEIGHT BEARING RESTRICTIONS: No  FALLS:  Has patient fallen in last 6 months? Yes. Number of falls 2-3x a month, injuries include broken toe, scrap on elbow, black eye  LIVING ENVIRONMENT: Lives with: lives with their spouse who has disabilities  Lives in: [place], 2 story with master bedroom and bathroom downstairs  Stairs: Yes: Internal: 8+8 steps; on right going up and External: 4-5 steps; on right going up Has following equipment at home: Single point cane, Environmental consultant - 2 wheeled, Tour manager, and Grab bars  OCCUPATION: retired   PLOF: Independent  He reports repetitive falls   PATIENT GOALS:  Get rid of R knee pain. Improve strength and balance to decrease fall frequency   NEXT MD VISIT:  tbd   OBJECTIVE:  DIAGNOSTIC FINDINGS:  10/27/2023, Radiographs of his R knee show degenerative changes with no acute  fractures noted   Patient-Specific Activity Scoring Scheme  0 represents "unable to perform." 10 represents "able to perform at prior level. 0 1 2 3 4 5 6 7 8 9  10 (Date and Score)   Activity Eval  11/09/23 12/30/2023   1. ADLs 5 6   2. Walking 5 6   3. Prevent falls 5 6  4.    5.    Score 5 6   Total score = sum of the activity scores/number of activities Minimum detectable change (90%CI) for average score = 2 points Minimum detectable change (90%CI) for single activity score  = 3 points  COGNITION: Overall cognitive status: WFL    SENSATION: Patient states no sensation in his feet due to diabetes  MUSCLE LENGTH Hamstring (hip position at 90* and knee extending) R: - 38* L: -48*  POSTURE: rounded shoulders and forward head  LOWER EXTREMITY ROM:   ROM Right eval Left eval  Hip flexion    Hip extension    Hip abduction    Hip adduction    Hip internal rotation    Hip external rotation    Knee flexion    Knee extension    Ankle dorsiflexion P: - 6 P: - 8  Ankle plantarflexion    Ankle inversion    Ankle eversion     (Blank rows = not tested)  LOWER EXTREMITY MMT:  MMT Right eval Left eval Right 01/13/2024 Left 01/13/2024  Hip flexion      Hip extension   3+/5 4/5  Hip abduction   5/5 5/5  Hip adduction      Hip internal rotation      Hip external rotation      Knee flexion      Knee extension 5/5 5/5    Ankle dorsiflexion      Ankle plantarflexion      Ankle inversion      Ankle eversion       (Blank rows = not tested)   FUNCTIONAL TESTS:  01/26/2024: 5x Sit to/from stand using 18 chair without armrests with BUEs on seat: 35.38 sec.  12/21/2023 TUG: 11.91 seconds   12/08/2023:  Timed Up & Go: 12.13 seconds no AD  Berg Balance 39/56   11/23/2023 TUG: 13.25 sec with CGA Cognitive TUG:14.37 sec with CGA stopped naming during turns Gait Speed (self-selected):  1.29 ft/sec Gait Speed (fast): 1.39 ft/sec  11/09/2023 Berg Balance 21/56  GAIT 01/26/2024:  Gait Velocity:  without device 2.89 ft/sec;  with single point cane std tip 3.23 ft/sec;  with single point cane stand alone tip 3.18 ft/sec  01/21/2024: Ambulation with SPC in Rt UE showed improved stability.  After education, sequencing was consistently performed well.  Head turns and distractions definitely impact stability in ambulation resulting in lateral or posterior weight displacement.   01/11/2024: Ambulation independent with variable postural sway and moments of LOB  requiring stepping to correct.    Eval:   Gait pattern: excessive arm swing for counterbalance BUEs, step through pattern, decreased step length- Right, decreased step length- Left, decreased stride length, decreased hip/knee flexion- Right, decreased hip/knee flexion- Left, knee flexed in stance- Right, lateral lean- Left, trunk flexed, and wide BOS Distance walked: 100' x 2 Assistive device utilized: None Level of assistance: SBA / verbal cues needed for gait deviations                  TODAY'S TREATMENT  DATE: 01/28/2024  TherEx:  Recumbent bike level 4 for 8 minutes  PT discussing HEP and importance of follow through at home to maintain and progress upon noted improvements   Neuro Re-Ed:  Patient ambulated with [facility] for 2x335' with CGA with cognitive dual tasks Slight Lt deviation from path intermittently during task, but no overt LOB or need for increased physical assist  Seated rest break required after performance  Patient performed 10' ambulation with SPC with CGA and performed quick turn and stop to assess balance  Patient with slight sway upon immediate stopping of movement following turn, but no overt LOB or required increase in physical assist  PT discussing 3 balance systems, how they are affected with age and comorbidities, and how the systems require challenges in order to improve similar to muscle and cardiovascular endurance  PT discussing how use of SPC assists with balance activities and ambulation   Self-Care:  PT discussed POC in depth and importance of physical activity  TODAY'S TREATMENT                                                                          DATE: 01/26/2024  Therapeutic Exercise:  Recumbent bike seat 8, level 4 for 8 minutes working on range and fluency on movement  PT instructed patient and recommendation for ongoing HEP to maintain flexibility, strength and muscle endurance  over time.  PT explained that performing exercises for short period of time and then stopping limits the functional outcomes or benefits.  Patient verbalized understanding.  Therapeutic Activities: PT demo & verbal cues on sit to/from stand technique with focus on weight over feet for sit down and standing up.  Pt return demo understanding.   See objective for 5x STS.    Gait Training PT educated patient with verbal cues and Internet for pictures of base of support and how use of a cane increases the size of his base of support.  We maintain balance by keeping our center of mass over base and with a larger base it is easier to maintain your balance.  Patient verbalized understanding. PT educated patient on different designs of canes with recommendation to avoid any folding canes that will twist within the frame.  PT is recommending ideally an offset handle cane.  Patient trialed ambulation with a standard tip and with a stand-alone tip.  He prefers the standard tip and PT did not note significant difference. See objective data for gait velocity without a device and with cane.  Patient feels like he is actually slower with a cane but his gait velocity is actually slightly increased. Patient ambulated 150 feet x 2 with cane with supervision.  Patient has greatest difficulty when turning.       TREATMENT                                                                          DATE: 01/20/2024  Therapeutic Exercise:  Recumbent bike  seat 8, level 3 for 8 minutes working on range and fluency on movement   Gait Training PT verbally educated patient on importance of using SPC during ambulation to help with balance to prevent falls.  Patient verbalized understanding PT demoed how to use SPC during ambulation.  Patient verbalized and demo understanding.  Patient ambulated 120 with cane and supervision while focused on gait sequence PT verbally educated patient to stop walking then  turning to talk to  people because it causes him to lose balance.  Patient verbalized understanding but immediately repeated walking with head turning.  Patient ambulated 60 with cane and supervision Extended time required for verbal discussion about merits of SPC use and stability improvements as a result.   Neuro Re-Ed Weight shift bwd/fwd ankle strategy 2 mins with SBA and consistent cues.  Looking back over shoulders in both directions while standing in // bars Standing with feet shoulder width apart on the floor, scanning in the following directions: Lateral, up/down, and diagonal in both directions.  In // bars for UE support. Occasional HHA to re-stabilize Standing with feet shoulder width apart on the foam, scanning in the following directions: Lateral, up/down, and diagonal in both directions.  In // bars for UE support.  Patient had the most difficulty with looking diagonally and in an upward direction and needed HHA to re-stabilize   TREATMENT                                                                          DATE: 01/18/2024  Therapeutic Exercise:  Recumbent bike seat 8, level 3 for 8 minutes working on range and fluency on movement  Hooklying trunk rotation Supine clamshells, green resistance bands, 2x8 Sidelying abduction 1x8 each side   Neuro Re-Ed Weight shift bwd/fwd and laterally for ankle strategy 2 mins each with SBA and consistent cues.  Step over four hurdles w/ UE regression: bilateral UE, single UE, no UE. Patient able to do step through gait pattern with bilateral and single UE. Step to gait pattern when using no UE due to unsteadiness     PATIENT EDUCATION:  Education details: POC Person educated: Patient Education method: Explanation, Verbal cues, handout Education comprehension: verbalized understanding  HOME EXERCISE PROGRAM: Access Code: [email or web address] URL: [email or web address] Date: 01/18/2024 Prepared by: Ismael Theophilus Stallion  Exercises - Gastroc  Stretch on Step  - 1-2 x daily - 7 x weekly - 1 sets - 3 reps - 30 seconds hold - Seated Hamstring Stretch with Strap  - 1-2 x daily - 7 x weekly - 1 sets - 3 reps - 30 seconds hold - wide stance head motions eyes open  - 1 x daily - 4 x weekly - 1 sets - 5-10 reps - 2 seconds hold - Standing Balance with Eyes Closed  - 1 x daily - 4 x weekly - 1 sets - 3 reps - 10 seconds hold - Feet Apart with Eyes Closed with Head Motions  - 1 x daily - 4 x weekly - 1 sets - 5-10 reps - 2 seconds hold - Wide stance on Foam Pad head movements  - 1 x daily - 4 x weekly - 1 sets - 5-10 reps - 2 seconds  hold - Squat with Chair and Counter Support  - 1 x daily - 4-7 x weekly - 2 sets - 10 reps - 5 seconds hold - sit to stand and stand to sit  - 1 x daily - 4-7 x weekly - 2 sets - 10 reps - 5 seconds hold - Carioca with Counter Support  - 1 x daily - 4-7 x weekly - 2 sets - 10 reps - Standing Terminal Knee Extension with Resistance  - 1 x daily - 7 x weekly - 2-3 sets - 10 reps - 3-5 seconds hold - Supine Bridge  - 1-2 x daily - 7 x weekly - 1-2 sets - 10 reps - 2 hold - Hooklying Isometric Clamshell  - 1-2 x daily - 7 x weekly - 2-3 sets - 10-15 reps - Supine Lower Trunk Rotation  - 2-3 x daily - 7 x weekly - 1 sets - 3-5 reps - 15 hold  ASSESSMENT: CLINICAL IMPRESSION:  Patient arrived to session noting no change in symptoms and endorses not having the appropriate cane for comfort and abilities. Patient tolerated all activities and acknowledges that he has made improvements. Patient also endorses that he believes he is confident to complete PT after following two sessions. Patient will continue to benefit from skilled PT.    OBJECTIVE IMPAIRMENTS: Abnormal gait, cardiopulmonary status limiting activity, decreased balance, decreased endurance, decreased knowledge of use of DME, decreased mobility, decreased ROM, decreased strength, impaired flexibility, impaired sensation, postural dysfunction, and pain.    ACTIVITY LIMITATIONS: carrying, lifting, standing, stairs, transfers, and locomotion level  PARTICIPATION LIMITATIONS: meal prep, cleaning, laundry, community activity, and yard work  PERSONAL FACTORS: Age, Fitness, Past/current experiences, Time since onset of injury/illness/exacerbation, and 3+ comorbidities: see PMH are also affecting patient's functional outcome.   REHAB POTENTIAL: Good  CLINICAL DECISION MAKING: Evolving/moderate complexity  EVALUATION COMPLEXITY: Moderate   GOALS: Goals reviewed with patient? Yes  SHORT TERM GOALS: target date for Short term goals 12/07/23   1.  Patient will demonstrate independent use of home exercise program to maintain progress from in clinic treatments.  Baseline: See objective data Goal status: MET, 12/08/2023  2. Patient will demonstrate TUG </= 12 seconds without use of UE for turning.  Baseline: See objective data Goal status: MET,  12/21/2023  3. BERG score >/= 25/56 Baseline: See objective data Goal status: MET, 12/08/2023  LONG TERM GOALS: target dates for all long term goals 02/04/24   1. Patient will demonstrate/report pain at worst less than or equal to 2/10 to facilitate minimal limitation in daily activity secondary to pain symptoms. Baseline: See objective data Goal status: ongoing   01/26/2024   2. Patient will demonstrate independent use of home exercise program to facilitate ability to maintain/progress functional gains from skilled physical therapy services. Baseline: See objective data Goal status: ongoing   01/26/2024  3.  BERG score >/= 30/56 for MDIC  Baseline: SEE OBJECTIVE DATA Goal status: ongoing   01/26/2024   4. Patient will demonstrate TUG </= 10 seconds without use of UE for turning.  Baseline: See objective data Goal status: ongoing   01/26/2024   5.  Patient will report no falls within the last 4 weeks of PT Baseline: See objective data Goal status:ongoing   01/26/2024   6.  Patient will ambulate  > 300' with appropriate assistive device with modified independence Baseline: See objective data Goal status: ongoing   01/26/2024   PLAN:  PT FREQUENCY:  2x/week  PT DURATION:  12 weeks  PLANNED INTERVENTIONS: 97164- PT Re-evaluation, 97750- Physical Performance Testing, 97110-Therapeutic exercises, 97530- Therapeutic activity, 97112- Neuromuscular re-education, [phone]- Self Care, [id number]- Gait training, Patient/Family education, Balance training, Stair training, and DME instructions  PLAN FOR NEXT SESSION:  continue to recommend to use of cane for community or walking in his yard (reports his man cave is in back yard), progress to include functional gait activities, challenge balance with scanning in different directions     Susannah Daring, PT, DPT 01/28/24 12:49 PM

## 2024-01-28 ENCOUNTER — Ambulatory Visit (INDEPENDENT_AMBULATORY_CARE_PROVIDER_SITE_OTHER)

## 2024-01-28 DIAGNOSIS — R2689 Other abnormalities of gait and mobility: Secondary | ICD-10-CM | POA: Diagnosis not present

## 2024-01-28 DIAGNOSIS — M6281 Muscle weakness (generalized): Secondary | ICD-10-CM

## 2024-01-28 DIAGNOSIS — R296 Repeated falls: Secondary | ICD-10-CM

## 2024-01-28 DIAGNOSIS — R2681 Unsteadiness on feet: Secondary | ICD-10-CM

## 2024-02-01 ENCOUNTER — Ambulatory Visit: Admitting: Rehabilitative and Restorative Service Providers"

## 2024-02-01 ENCOUNTER — Encounter: Payer: Self-pay | Admitting: Rehabilitative and Restorative Service Providers"

## 2024-02-01 DIAGNOSIS — M6281 Muscle weakness (generalized): Secondary | ICD-10-CM

## 2024-02-01 DIAGNOSIS — R2681 Unsteadiness on feet: Secondary | ICD-10-CM | POA: Diagnosis not present

## 2024-02-01 DIAGNOSIS — R2689 Other abnormalities of gait and mobility: Secondary | ICD-10-CM | POA: Diagnosis not present

## 2024-02-01 DIAGNOSIS — G8929 Other chronic pain: Secondary | ICD-10-CM

## 2024-02-01 DIAGNOSIS — R296 Repeated falls: Secondary | ICD-10-CM

## 2024-02-01 DIAGNOSIS — M25561 Pain in right knee: Secondary | ICD-10-CM

## 2024-02-01 DIAGNOSIS — R29898 Other symptoms and signs involving the musculoskeletal system: Secondary | ICD-10-CM

## 2024-02-01 NOTE — Therapy (Signed)
 OUTPATIENT PHYSICAL THERAPY TREATMENT   Patient Name: Gabriel Evans MRN: [id number] DOB:Apr 13, 1941, 83 y.o., male Today's Date: 02/01/2024    END OF SESSION:  PT End of Session - 02/01/24 1158     Visit Number 22    Number of Visits 25    Date for Recertification  02/04/24    Authorization Type MEDICARE AND [employer]    Progress Note Due on Visit 25    PT Start Time 1150    PT Stop Time 1229    PT Time Calculation (min) 39 min    Activity Tolerance Patient tolerated treatment well    Behavior During Therapy WFL for tasks assessed/performed             Past Medical History:  Diagnosis Date   Allergy    Cataract    Diabetes mellitus without complication (HCC)    Thyroid  disease    Past Surgical History:  Procedure Laterality Date   EYE SURGERY     FRACTURE SURGERY     JOINT REPLACEMENT     VASECTOMY     Patient Active Problem List   Diagnosis Date Noted   Pain in right elbow 05/29/2022   Pain in right knee 05/29/2022   Occlusion and stenosis of bilateral carotid arteries 06/26/2021   Diabetes (HCC) 03/19/2021   Skin lesion 03/19/2021   Callus 02/28/2020   Chronic kidney disease, stage 3a (HCC) 04/07/2019   Insomnia 04/07/2019   Benign prostatic hyperplasia with lower urinary tract symptoms 12/03/2017   Chronic obstructive pulmonary disease (HCC) 04/29/2017   Abnormal gait 02/03/2017   Anxiety disorder 02/03/2017   Benign neoplasm of colon 02/03/2017   Hyperlipidemia 02/03/2017   Polyneuropathy due to type 2 diabetes mellitus (HCC) 02/03/2017   Postoperative hypothyroidism 02/03/2017    PCP: Nichole Senior, MD  REFERRING PROVIDER: Persons, Ronal Dragon, PA   REFERRING DIAG: [phone] (ICD-10-CM) - Chronic pain of right knee   THERAPY DIAG:  Unsteadiness on feet  Repeated falls  Muscle weakness (generalized)  Other abnormalities of gait and mobility  Other symptoms and signs involving the musculoskeletal system  Chronic pain of right  knee  Rationale for Evaluation and Treatment: Rehabilitation  ONSET DATE: 10/27/23 PA referral to PT  SUBJECTIVE:   SUBJECTIVE STATEMENT:    Pt indicated a fall into and over in a chair.  Pt indicated he was moving quick in the movement.  Needed help to try to get up.  Reported no pain from incident.    PERTINENT HISTORY: Occlusion and stenosis of bilateral carotid arteries, diabetes, polyneuropathy, chronic kidney disease stage 3a, Benign prostatic hyperplasia with lower urinary tract symptoms, COPD, Benign neoplasm of colon, Cataract, Joint replacement  PAIN:   NPRS scale: today 2/10 Pain location: hamstring and calf Pain description: tightness and racheting Aggravating factors: initially standing up  Relieving factors: tylenol and taking a few steps   PRECAUTIONS: Fall  RED FLAGS: None   WEIGHT BEARING RESTRICTIONS: No  FALLS:  Has patient fallen in last 6 months? Yes. Number of falls 2-3x a month, injuries include broken toe, scrap on elbow, black eye  LIVING ENVIRONMENT: Lives with: lives with their spouse who has disabilities  Lives in: [place], 2 story with master bedroom and bathroom downstairs  Stairs: Yes: Internal: 8+8 steps; on right going up and External: 4-5 steps; on right going up Has following equipment at home: Single point cane, Environmental consultant - 2 wheeled, Tour manager, and Grab bars  OCCUPATION: retired   PLOF: Independent  He reports repetitive falls   PATIENT GOALS:  Get rid of R knee pain. Improve strength and balance to decrease fall frequency   NEXT MD VISIT:  tbd   OBJECTIVE:  DIAGNOSTIC FINDINGS:  10/27/2023, Radiographs of his R knee show degenerative changes with no acute fractures noted   Patient-Specific Activity Scoring Scheme  0 represents "unable to perform." 10 represents "able to perform at prior level. 0 1 2 3 4 5 6 7 8 9  10 (Date and Score)   Activity Eval  11/09/23 12/30/2023  02/01/2024  1. ADLs 5 6  9   2. Walking 5 6  5   3.  Prevent falls 5 6   4.     5.     Score 5 6 7  avg for items 1/2   Total score = sum of the activity scores/number of activities Minimum detectable change (90%CI) for average score = 2 points Minimum detectable change (90%CI) for single activity score = 3 points  COGNITION: Overall cognitive status: WFL    SENSATION: Patient states no sensation in his feet due to diabetes  MUSCLE LENGTH Hamstring (hip position at 90* and knee extending) R: - 38* L: -48*  POSTURE: rounded shoulders and forward head  LOWER EXTREMITY ROM:   ROM Right eval Left eval  Hip flexion    Hip extension    Hip abduction    Hip adduction    Hip internal rotation    Hip external rotation    Knee flexion    Knee extension    Ankle dorsiflexion P: - 6 P: - 8  Ankle plantarflexion    Ankle inversion    Ankle eversion     (Blank rows = not tested)  LOWER EXTREMITY MMT:  MMT Right eval Left eval Right 01/13/2024 Left 01/13/2024  Hip flexion      Hip extension   3+/5 4/5  Hip abduction   5/5 5/5  Hip adduction      Hip internal rotation      Hip external rotation      Knee flexion      Knee extension 5/5 5/5    Ankle dorsiflexion      Ankle plantarflexion      Ankle inversion      Ankle eversion       (Blank rows = not tested)   FUNCTIONAL TESTS:  01/26/2024: 5x Sit to/from stand using 18 chair without armrests with BUEs on seat: 35.38 sec.  12/21/2023 TUG: 11.91 seconds   12/08/2023:  Timed Up & Go: 12.13 seconds no AD  Berg Balance 39/56   11/23/2023 TUG: 13.25 sec with CGA Cognitive TUG:14.37 sec with CGA stopped naming during turns Gait Speed (self-selected):  1.29 ft/sec Gait Speed (fast): 1.39 ft/sec  11/09/2023 Berg Balance 21/56  GAIT 01/26/2024:  Gait Velocity:  without device 2.89 ft/sec;  with single point cane std tip 3.23 ft/sec;  with single point cane stand alone tip 3.18 ft/sec  01/21/2024: Ambulation with SPC in Rt UE showed improved stability.  After  education, sequencing was consistently performed well.  Head turns and distractions definitely impact stability in ambulation resulting in lateral or posterior weight displacement.   01/11/2024: Ambulation independent with variable postural sway and moments of LOB requiring stepping to correct.    Eval:   Gait pattern: excessive arm swing for counterbalance BUEs, step through pattern, decreased step length- Right, decreased step length- Left, decreased stride length, decreased hip/knee flexion- Right, decreased hip/knee flexion- Left, knee flexed in  stance- Right, lateral lean- Left, trunk flexed, and wide BOS Distance walked: 100' x 2 Assistive device utilized: None Level of assistance: SBA / verbal cues needed for gait deviations                  TODAY'S TREATMENT                                                                          DATE: 02/01/2024 Therex: Recumbent lvl 3 8 mins  Review of stretching for hip, verbally  Seated alternating df/pf x 15 each for ankle contorl.   Neuro Re-ed: (balance improvements, stability) Additional time spent in cues and education on intervention for HEP inclusion.  Feet together in corner head still 1 min  Feet together in corner head turns Lt and Rt slowly 30 sec x 2 Feet together in corner head up/down slowly 30 sec x 2  Feet close but not touching church pew anterior/posterior weight shifting 1 min in corner Modified tandem stance foot forward 75% of foot size 30 sec x 3 bilaterally in corner   TherActivity Sit to stand to sit 18 inch chair s UE assist x 15.  Cues for head up to prevent anterior loss of balance (Mod A to correct one loss of balance)       TODAY'S TREATMENT                                                                          DATE: 01/28/2024  TherEx:  Recumbent bike level 4 for 8 minutes  PT discussing HEP and importance of follow through at home to maintain and progress upon noted improvements   Neuro Re-Ed:  Patient  ambulated with [facility] for 2x335' with CGA with cognitive dual tasks Slight Lt deviation from path intermittently during task, but no overt LOB or need for increased physical assist  Seated rest break required after performance  Patient performed 10' ambulation with SPC with CGA and performed quick turn and stop to assess balance  Patient with slight sway upon immediate stopping of movement following turn, but no overt LOB or required increase in physical assist  PT discussing 3 balance systems, how they are affected with age and comorbidities, and how the systems require challenges in order to improve similar to muscle and cardiovascular endurance  PT discussing how use of SPC assists with balance activities and ambulation   Self-Care:  PT discussed POC in depth and importance of physical activity  TODAY'S TREATMENT                                                                          DATE: 01/26/2024  Therapeutic Exercise:  Recumbent bike seat 8, level 4 for 8 minutes working on range and fluency on movement  PT instructed patient and recommendation for ongoing HEP to maintain flexibility, strength and muscle endurance over time.  PT explained that performing exercises for short period of time and then stopping limits the functional outcomes or benefits.  Patient verbalized understanding.  Therapeutic Activities: PT demo & verbal cues on sit to/from stand technique with focus on weight over feet for sit down and standing up.  Pt return demo understanding.   See objective for 5x STS.    Gait Training PT educated patient with verbal cues and Internet for pictures of base of support and how use of a cane increases the size of his base of support.  We maintain balance by keeping our center of mass over base and with a larger base it is easier to maintain your balance.  Patient verbalized understanding. PT educated patient on different designs of canes with recommendation to avoid any folding canes  that will twist within the frame.  PT is recommending ideally an offset handle cane.  Patient trialed ambulation with a standard tip and with a stand-alone tip.  He prefers the standard tip and PT did not note significant difference. See objective data for gait velocity without a device and with cane.  Patient feels like he is actually slower with a cane but his gait velocity is actually slightly increased. Patient ambulated 150 feet x 2 with cane with supervision.  Patient has greatest difficulty when turning.       TREATMENT                                                                          DATE: 01/20/2024  Therapeutic Exercise:  Recumbent bike seat 8, level 3 for 8 minutes working on range and fluency on movement   Gait Training PT verbally educated patient on importance of using SPC during ambulation to help with balance to prevent falls.  Patient verbalized understanding PT demoed how to use SPC during ambulation.  Patient verbalized and demo understanding.  Patient ambulated 120 with cane and supervision while focused on gait sequence PT verbally educated patient to stop walking then  turning to talk to people because it causes him to lose balance.  Patient verbalized understanding but immediately repeated walking with head turning.  Patient ambulated 60 with cane and supervision Extended time required for verbal discussion about merits of SPC use and stability improvements as a result.   Neuro Re-Ed Weight shift bwd/fwd ankle strategy 2 mins with SBA and consistent cues.  Looking back over shoulders in both directions while standing in // bars Standing with feet shoulder width apart on the floor, scanning in the following directions: Lateral, up/down, and diagonal in both directions.  In // bars for UE support. Occasional HHA to re-stabilize Standing with feet shoulder width apart on the foam, scanning in the following directions: Lateral, up/down, and diagonal in both  directions.  In // bars for UE support.  Patient had the most difficulty with looking diagonally and in an upward direction and needed HHA to re-stabilize    PATIENT EDUCATION:  Education details: POC Person educated: Patient Education method: Explanation, Verbal cues, handout  Education comprehension: verbalized understanding  HOME EXERCISE PROGRAM: Access Code: [email or web address] URL: [email or web address] Date: 01/18/2024 Prepared by: Ismael Theophilus Stallion  Exercises - Gastroc Stretch on Step  - 1-2 x daily - 7 x weekly - 1 sets - 3 reps - 30 seconds hold - Seated Hamstring Stretch with Strap  - 1-2 x daily - 7 x weekly - 1 sets - 3 reps - 30 seconds hold - wide stance head motions eyes open  - 1 x daily - 4 x weekly - 1 sets - 5-10 reps - 2 seconds hold - Standing Balance with Eyes Closed  - 1 x daily - 4 x weekly - 1 sets - 3 reps - 10 seconds hold - Feet Apart with Eyes Closed with Head Motions  - 1 x daily - 4 x weekly - 1 sets - 5-10 reps - 2 seconds hold - Wide stance on Foam Pad head movements  - 1 x daily - 4 x weekly - 1 sets - 5-10 reps - 2 seconds hold - Squat with Chair and Counter Support  - 1 x daily - 4-7 x weekly - 2 sets - 10 reps - 5 seconds hold - sit to stand and stand to sit  - 1 x daily - 4-7 x weekly - 2 sets - 10 reps - 5 seconds hold - Carioca with Counter Support  - 1 x daily - 4-7 x weekly - 2 sets - 10 reps - Standing Terminal Knee Extension with Resistance  - 1 x daily - 7 x weekly - 2-3 sets - 10 reps - 3-5 seconds hold - Supine Bridge  - 1-2 x daily - 7 x weekly - 1-2 sets - 10 reps - 2 hold - Hooklying Isometric Clamshell  - 1-2 x daily - 7 x weekly - 2-3 sets - 10-15 reps - Supine Lower Trunk Rotation  - 2-3 x daily - 7 x weekly - 1 sets - 3-5 reps - 15 hold  ASSESSMENT: CLINICAL IMPRESSION:  Pt had a near fall upon entry to clinic when turning to get hand sanitizer.  Required min A to mod A correction from clinician.  Difficulty in  balance challenges with reactionary movements to external stimuli (name call, looking) .  Medical necessity for continued skilled PT services indicated to help address complaints and reduce fall risk.    OBJECTIVE IMPAIRMENTS: Abnormal gait, cardiopulmonary status limiting activity, decreased balance, decreased endurance, decreased knowledge of use of DME, decreased mobility, decreased ROM, decreased strength, impaired flexibility, impaired sensation, postural dysfunction, and pain.   ACTIVITY LIMITATIONS: carrying, lifting, standing, stairs, transfers, and locomotion level  PARTICIPATION LIMITATIONS: meal prep, cleaning, laundry, community activity, and yard work  PERSONAL FACTORS: Age, Fitness, Past/current experiences, Time since onset of injury/illness/exacerbation, and 3+ comorbidities: see PMH are also affecting patient's functional outcome.   REHAB POTENTIAL: Good  CLINICAL DECISION MAKING: Evolving/moderate complexity  EVALUATION COMPLEXITY: Moderate   GOALS: Goals reviewed with patient? Yes  SHORT TERM GOALS: target date for Short term goals 12/07/23   1.  Patient will demonstrate independent use of home exercise program to maintain progress from in clinic treatments.  Baseline: See objective data Goal status: MET, 12/08/2023  2. Patient will demonstrate TUG </= 12 seconds without use of UE for turning.  Baseline: See objective data Goal status: MET,  12/21/2023  3. BERG score >/= 25/56 Baseline: See objective data Goal status: MET, 12/08/2023  LONG TERM GOALS: target dates for all long term goals 02/04/24  1. Patient will demonstrate/report pain at worst less than or equal to 2/10 to facilitate minimal limitation in daily activity secondary to pain symptoms. Baseline: See objective data Goal status: ongoing   01/26/2024   2. Patient will demonstrate independent use of home exercise program to facilitate ability to maintain/progress functional gains from skilled physical  therapy services. Baseline: See objective data Goal status: ongoing   01/26/2024  3.  BERG score >/= 30/56 for MDIC  Baseline: SEE OBJECTIVE DATA Goal status: ongoing   01/26/2024   4. Patient will demonstrate TUG </= 10 seconds without use of UE for turning.  Baseline: See objective data Goal status: ongoing   01/26/2024   5.  Patient will report no falls within the last 4 weeks of PT Baseline: See objective data Goal status:ongoing   01/26/2024   6.  Patient will ambulate > 300' with appropriate assistive device with modified independence Baseline: See objective data Goal status: ongoing   01/26/2024   PLAN:  PT FREQUENCY:  2x/week  PT DURATION: 12 weeks  PLANNED INTERVENTIONS: 97164- PT Re-evaluation, 97750- Physical Performance Testing, 97110-Therapeutic exercises, 97530- Therapeutic activity, [id number]- Neuromuscular re-education, 97535- Self Care, [id number]- Gait training, Patient/Family education, Balance training, Stair training, and DME instructions  PLAN FOR NEXT SESSION:  Has one more scheduled visit.  Review and update HEP for use.    Ozell Silvan, PT, DPT, OCS, ATC 02/01/24  12:27 PM

## 2024-02-03 ENCOUNTER — Encounter: Payer: Self-pay | Admitting: Rehabilitative and Restorative Service Providers"

## 2024-02-03 ENCOUNTER — Ambulatory Visit (INDEPENDENT_AMBULATORY_CARE_PROVIDER_SITE_OTHER): Admitting: Rehabilitative and Restorative Service Providers"

## 2024-02-03 DIAGNOSIS — R2689 Other abnormalities of gait and mobility: Secondary | ICD-10-CM

## 2024-02-03 DIAGNOSIS — M6281 Muscle weakness (generalized): Secondary | ICD-10-CM

## 2024-02-03 DIAGNOSIS — G8929 Other chronic pain: Secondary | ICD-10-CM

## 2024-02-03 DIAGNOSIS — R2681 Unsteadiness on feet: Secondary | ICD-10-CM

## 2024-02-03 DIAGNOSIS — M25561 Pain in right knee: Secondary | ICD-10-CM

## 2024-02-03 DIAGNOSIS — R296 Repeated falls: Secondary | ICD-10-CM | POA: Diagnosis not present

## 2024-02-03 DIAGNOSIS — R29898 Other symptoms and signs involving the musculoskeletal system: Secondary | ICD-10-CM

## 2024-02-03 NOTE — Therapy (Addendum)
 " OUTPATIENT PHYSICAL THERAPY TREATMENT  / DISCHARGE  Patient Name: Gabriel Evans MRN: [id number] DOB:[birth year], 83 y.o., male Today's Date: 02/03/2024    END OF SESSION:  PT End of Session - 02/03/24 1143     Visit Number 23    Number of Visits 25    Date for Recertification  02/04/24    Authorization Type MEDICARE AND [employer]    Progress Note Due on Visit 25    PT Start Time 1140    PT Stop Time 1222    PT Time Calculation (min) 42 min    Activity Tolerance Patient tolerated treatment well    Behavior During Therapy WFL for tasks assessed/performed              Past Medical History:  Diagnosis Date   Allergy    Cataract    Diabetes mellitus without complication (HCC)    Thyroid  disease    Past Surgical History:  Procedure Laterality Date   EYE SURGERY     FRACTURE SURGERY     JOINT REPLACEMENT     VASECTOMY     Patient Active Problem List   Diagnosis Date Noted   Pain in right elbow 05/29/2022   Pain in right knee 05/29/2022   Occlusion and stenosis of bilateral carotid arteries 06/26/2021   Diabetes (HCC) 03/19/2021   Skin lesion 03/19/2021   Callus 02/28/2020   Chronic kidney disease, stage 3a (HCC) 04/07/2019   Insomnia 04/07/2019   Benign prostatic hyperplasia with lower urinary tract symptoms 12/03/2017   Chronic obstructive pulmonary disease (HCC) 04/29/2017   Abnormal gait 02/03/2017   Anxiety disorder 02/03/2017   Benign neoplasm of colon 02/03/2017   Hyperlipidemia 02/03/2017   Polyneuropathy due to type 2 diabetes mellitus (HCC) 02/03/2017   Postoperative hypothyroidism 02/03/2017    PCP: Nichole Senior, MD  REFERRING PROVIDER: Persons, Ronal Dragon, PA   REFERRING DIAG: [phone] (ICD-10-CM) - Chronic pain of right knee   THERAPY DIAG:  Unsteadiness on feet  Repeated falls  Muscle weakness (generalized)  Other abnormalities of gait and mobility  Other symptoms and signs involving the musculoskeletal system  Chronic pain of  right knee  Rationale for Evaluation and Treatment: Rehabilitation  ONSET DATE: 10/27/23 PA referral to PT  SUBJECTIVE:   SUBJECTIVE STATEMENT:    Pt indicated he went to the bathroom in the middle of the night and reported losing balance forward and hitting head on wall, No injury reported.    PERTINENT HISTORY: Occlusion and stenosis of bilateral carotid arteries, diabetes, polyneuropathy, chronic kidney disease stage 3a, Benign prostatic hyperplasia with lower urinary tract symptoms, COPD, Benign neoplasm of colon, Cataract, Joint replacement  PAIN:   NPRS scale: 2/10 Pain location: back of Rt leg  Pain description: tightness and racheting Aggravating factors: initially standing up  Relieving factors: tylenol and taking a few steps   PRECAUTIONS: Fall  RED FLAGS: None   WEIGHT BEARING RESTRICTIONS: No  FALLS:  Has patient fallen in last 6 months? Yes. Number of falls 2-3x a month, injuries include broken toe, scrap on elbow, black eye  LIVING ENVIRONMENT: Lives with: lives with their spouse who has disabilities  Lives in: [place], 2 story with master bedroom and bathroom downstairs  Stairs: Yes: Internal: 8+8 steps; on right going up and External: 4-5 steps; on right going up Has following equipment at home: Single point cane, Environmental Consultant - 2 wheeled, Tour manager, and Grab bars  OCCUPATION: retired   PLOF: Independent  He reports repetitive  falls   PATIENT GOALS:  Get rid of R knee pain. Improve strength and balance to decrease fall frequency   NEXT MD VISIT:  tbd   OBJECTIVE:  DIAGNOSTIC FINDINGS:  10/27/2023, Radiographs of his R knee show degenerative changes with no acute fractures noted   Patient-Specific Activity Scoring Scheme  0 represents unable to perform. 10 represents able to perform at prior level. 0 1 2 3 4 5 6 7 8 9  10 (Date and Score)   Activity Eval  11/09/23 12/30/2023  02/01/2024  1. ADLs 5 6  9   2. Walking 5 6  5   3. Prevent falls 5 6    4.     5.     Score 5 6 7  avg for items 1/2   Total score = sum of the activity scores/number of activities Minimum detectable change (90%CI) for average score = 2 points Minimum detectable change (90%CI) for single activity score = 3 points  COGNITION: Overall cognitive status: WFL    SENSATION: Patient states no sensation in his feet due to diabetes  MUSCLE LENGTH Hamstring (hip position at 90* and knee extending) R: - 38* L: -48*  POSTURE: rounded shoulders and forward head  LOWER EXTREMITY ROM:   ROM Right eval Left eval  Hip flexion    Hip extension    Hip abduction    Hip adduction    Hip internal rotation    Hip external rotation    Knee flexion    Knee extension    Ankle dorsiflexion P: - 6 P: - 8  Ankle plantarflexion    Ankle inversion    Ankle eversion     (Blank rows = not tested)  LOWER EXTREMITY MMT:  MMT Right eval Left eval Right 01/13/2024 Left 01/13/2024 Right 02/03/2024 Left 02/03/2024  Hip flexion        Hip extension   3+/5 4/5    Hip abduction   5/5 5/5    Hip adduction        Hip internal rotation        Hip external rotation        Knee flexion        Knee extension 5/5 5/5      Ankle dorsiflexion        Ankle plantarflexion        Ankle inversion        Ankle eversion         (Blank rows = not tested)   FUNCTIONAL TESTS:  02/03/2024:  TUG:  SPC w SPC 13.64 seconds TUG without cane:  14.9 seconds with supervision required due to instaiblity.    02/03/24 0001  Berg Balance Test  Sit to Stand 4  Standing Unsupported 4  Sitting with Back Unsupported but Feet Supported on Floor or Stool 4  Stand to Sit 4  Transfers 4  Standing Unsupported with Eyes Closed 3  Standing Unsupported with Feet Together 3  From Standing, Reach Forward with Outstretched Arm 1  From Standing Position, Pick up Object from Floor 3  From Standing Position, Turn to Look Behind Over each Shoulder 3  Turn 360 Degrees 2  Standing Unsupported,  Alternately Place Feet on Step/Stool 2  Standing Unsupported, One Foot in Front 2  Standing on One Leg 1  Total Score 40     01/26/2024: 5x Sit to/from stand using 18 chair without armrests with BUEs on seat: 35.38 sec.  12/21/2023 TUG: 11.91 seconds   12/08/2023:  Timed  Up & Go: 12.13 seconds no AD  Berg Balance 39/56  [facility] PT Assessment - 02/03/24 0001       Berg Balance Test   Sit to Stand Able to stand without using hands and stabilize independently    Standing Unsupported Able to stand safely 2 minutes    Sitting with Back Unsupported but Feet Supported on Floor or Stool Able to sit safely and securely 2 minutes    Stand to Sit Sits safely with minimal use of hands    Transfers Able to transfer safely, minor use of hands    Standing Unsupported with Eyes Closed Able to stand 10 seconds with supervision    Standing Unsupported with Feet Together Able to place feet together independently and stand for 1 minute with supervision    From Standing, Reach Forward with Outstretched Arm Reaches forward but needs supervision    From Standing Position, Pick up Object from Floor Able to pick up shoe, needs supervision    From Standing Position, Turn to Look Behind Over each Shoulder Looks behind one side only/other side shows less weight shift    Turn 360 Degrees Able to turn 360 degrees safely but slowly    Standing Unsupported, Alternately Place Feet on Step/Stool Able to complete 4 steps without aid or supervision    Standing Unsupported, One Foot in Front Able to take small step independently and hold 30 seconds    Standing on One Leg Tries to lift leg/unable to hold 3 seconds but remains standing independently    Total Score 40          11/23/2023 TUG: 13.25 sec with CGA Cognitive TUG:14.37 sec with CGA stopped naming during turns Gait Speed (self-selected):  1.29 ft/sec Gait Speed (fast): 1.39 ft/sec  11/09/2023 Berg Balance 21/56  GAIT 01/26/2024:  Gait Velocity:  without  device 2.89 ft/sec;  with single point cane std tip 3.23 ft/sec;  with single point cane stand alone tip 3.18 ft/sec  01/21/2024: Ambulation with SPC in Rt UE showed improved stability.  After education, sequencing was consistently performed well.  Head turns and distractions definitely impact stability in ambulation resulting in lateral or posterior weight displacement.   01/11/2024: Ambulation independent with variable postural sway and moments of LOB requiring stepping to correct.    Eval:   Gait pattern: excessive arm swing for counterbalance BUEs, step through pattern, decreased step length- Right, decreased step length- Left, decreased stride length, decreased hip/knee flexion- Right, decreased hip/knee flexion- Left, knee flexed in stance- Right, lateral lean- Left, trunk flexed, and wide BOS Distance walked: 100' x 2 Assistive device utilized: None Level of assistance: SBA / verbal cues needed for gait deviations                  TODAY'S TREATMENT                                                                          DATE: 02/03/2024 Therex: Recumbent lvl 3 8 mins for mobility, aerobic intervention Review of HEP with updated handout.  Detailed techniques and performance.   TherActivity Advice and cues for setup for bathrroom rips at night with goals of improved safety.  Discussed use of FWW to  improve stability and control.  Also encouraged SPC use in daily activity , transfers and ambulation for safety.   Sit to stand to sit 18 inch chair no UE assist x 8    Physical Performance Testing: Berg testing with instruction and performance (see score above) as well as TUG performance.     TODAY'S TREATMENT                                                                          DATE: 02/01/2024 Therex: Recumbent lvl 3 8 mins  Review of stretching for hip, verbally  Seated alternating df/pf x 15 each for ankle contorl.   Neuro Re-ed: (balance improvements, stability) Additional  time spent in cues and education on intervention for HEP inclusion.  Feet together in corner head still 1 min  Feet together in corner head turns Lt and Rt slowly 30 sec x 2 Feet together in corner head up/down slowly 30 sec x 2  Feet close but not touching church pew anterior/posterior weight shifting 1 min in corner Modified tandem stance foot forward 75% of foot size 30 sec x 3 bilaterally in corner   TherActivity Sit to stand to sit 18 inch chair s UE assist x 15.  Cues for head up to prevent anterior loss of balance (Mod A to correct one loss of balance)       TODAY'S TREATMENT                                                                          DATE: 01/28/2024  TherEx:  Recumbent bike level 4 for 8 minutes  PT discussing HEP and importance of follow through at home to maintain and progress upon noted improvements   Neuro Re-Ed:  Patient ambulated with [facility] for 2x335' with CGA with cognitive dual tasks Slight Lt deviation from path intermittently during task, but no overt LOB or need for increased physical assist  Seated rest break required after performance  Patient performed 10' ambulation with SPC with CGA and performed quick turn and stop to assess balance  Patient with slight sway upon immediate stopping of movement following turn, but no overt LOB or required increase in physical assist  PT discussing 3 balance systems, how they are affected with age and comorbidities, and how the systems require challenges in order to improve similar to muscle and cardiovascular endurance  PT discussing how use of SPC assists with balance activities and ambulation   Self-Care:  PT discussed POC in depth and importance of physical activity   PATIENT EDUCATION:  Education details: HEP review Person educated: Patient Education method: Explanation, Verbal cues, handout Education comprehension: verbalized understanding  HOME EXERCISE PROGRAM: Access Code: [email or web address] URL:  [email or web address] Date: 01/18/2024 Prepared by: Ismael Theophilus Stallion  Exercises - Gastroc Stretch on Step  - 1-2 x daily - 7 x weekly - 1 sets - 3 reps - 30 seconds hold - Seated Hamstring Stretch with Strap  -  1-2 x daily - 7 x weekly - 1 sets - 3 reps - 30 seconds hold - wide stance head motions eyes open  - 1 x daily - 4 x weekly - 1 sets - 5-10 reps - 2 seconds hold - Standing Balance with Eyes Closed  - 1 x daily - 4 x weekly - 1 sets - 3 reps - 10 seconds hold - Feet Apart with Eyes Closed with Head Motions  - 1 x daily - 4 x weekly - 1 sets - 5-10 reps - 2 seconds hold - Wide stance on Foam Pad head movements  - 1 x daily - 4 x weekly - 1 sets - 5-10 reps - 2 seconds hold - Squat with Chair and Counter Support  - 1 x daily - 4-7 x weekly - 2 sets - 10 reps - 5 seconds hold - sit to stand and stand to sit  - 1 x daily - 4-7 x weekly - 2 sets - 10 reps - 5 seconds hold - Carioca with Counter Support  - 1 x daily - 4-7 x weekly - 2 sets - 10 reps - Standing Terminal Knee Extension with Resistance  - 1 x daily - 7 x weekly - 2-3 sets - 10 reps - 3-5 seconds hold - Supine Bridge  - 1-2 x daily - 7 x weekly - 1-2 sets - 10 reps - 2 hold - Hooklying Isometric Clamshell  - 1-2 x daily - 7 x weekly - 2-3 sets - 10-15 reps - Supine Lower Trunk Rotation  - 2-3 x daily - 7 x weekly - 1 sets - 3-5 reps - 15 hold  ASSESSMENT: CLINICAL IMPRESSION:  Pt has expressed thoughts about switching to trial HEP at home. Discussed importance of continued HEP and safety techniques to use during balance intervention to promote gains safety.  Also discussed options for improved safety in household mobility (walker at night, SPC in ambulation).  Updates to testing for balance showed maintaining gains compared to eval but still lacking compared to established norms (BERG > 45).  Pt will trial HEP at this time and return as necessary.    OBJECTIVE IMPAIRMENTS: Abnormal gait, cardiopulmonary  status limiting activity, decreased balance, decreased endurance, decreased knowledge of use of DME, decreased mobility, decreased ROM, decreased strength, impaired flexibility, impaired sensation, postural dysfunction, and pain.   ACTIVITY LIMITATIONS: carrying, lifting, standing, stairs, transfers, and locomotion level  PARTICIPATION LIMITATIONS: meal prep, cleaning, laundry, community activity, and yard work  PERSONAL FACTORS: Age, Fitness, Past/current experiences, Time since onset of injury/illness/exacerbation, and 3+ comorbidities: see PMH are also affecting patient's functional outcome.   REHAB POTENTIAL: Good  CLINICAL DECISION MAKING: Evolving/moderate complexity  EVALUATION COMPLEXITY: Moderate   GOALS: Goals reviewed with patient? Yes  SHORT TERM GOALS: target date for Short term goals 12/07/23   1.  Patient will demonstrate independent use of home exercise program to maintain progress from in clinic treatments.  Baseline: See objective data Goal status: MET, 12/08/2023  2. Patient will demonstrate TUG </= 12 seconds without use of UE for turning.  Baseline: See objective data Goal status: MET,  12/21/2023  3. BERG score >/= 25/56 Baseline: See objective data Goal status: MET, 12/08/2023  LONG TERM GOALS: target dates for all long term goals 02/04/24   1. Patient will demonstrate/report pain at worst less than or equal to 2/10 to facilitate minimal limitation in daily activity secondary to pain symptoms. Baseline: See objective data Goal status: Mostly met 02/03/2024  2. Patient will demonstrate independent use of home exercise program to facilitate ability to maintain/progress functional gains from skilled physical therapy services. Baseline: See objective data Goal status: mostly met 02/03/2024  3.  BERG score >/= 30/56 for [facility]IC  Baseline: SEE OBJECTIVE DATA Goal status: Met 02/03/2024   4. Patient will demonstrate TUG </= 10 seconds without use of UE for  turning.  Baseline: See objective data Goal status: partially met 02/03/2024   5.  Patient will report no falls within the last 4 weeks of PT Baseline: See objective data Goal status: not met 02/03/2024   6.  Patient will ambulate > 300' with appropriate assistive device with modified independence Baseline: See objective data Goal status: Met 02/03/2024 - SPC encouraged.    PLAN:  PT FREQUENCY:  2x/week  PT DURATION: 12 weeks  PLANNED INTERVENTIONS: [id number]- PT Re-evaluation, 97750- Physical Performance Testing, 97110-Therapeutic exercises, 97530- Therapeutic activity, [id number]- Neuromuscular re-education, 97535- Self Care, [id number]- Gait training, Patient/Family education, Balance training, Stair training, and DME instructions  PLAN FOR NEXT SESSION: Recert upon return.  Discharge after 30 days inactivity.    Ozell Silvan, PT, DPT, OCS, ATC 02/03/24  12:29 PM  PHYSICAL THERAPY DISCHARGE SUMMARY  Visits from Start of Care: 23  Current functional level related to goals / functional outcomes: See note   Remaining deficits: See note   Education / Equipment: HEP  Patient goals were met. Patient is being discharged due to not returning since the last visit.  Ozell Silvan, PT, DPT, OCS, ATC 05/25/24  7:35 AM      "

## 2024-03-07 ENCOUNTER — Encounter: Payer: Self-pay | Admitting: Radiology

## 2024-04-19 ENCOUNTER — Ambulatory Visit: Admitting: Podiatry

## 2024-04-21 ENCOUNTER — Ambulatory Visit (INDEPENDENT_AMBULATORY_CARE_PROVIDER_SITE_OTHER): Admitting: Podiatry

## 2024-04-21 ENCOUNTER — Encounter: Payer: Self-pay | Admitting: Podiatry

## 2024-04-21 VITALS — Ht 70.5 in | Wt 203.0 lb

## 2024-04-21 DIAGNOSIS — B351 Tinea unguium: Secondary | ICD-10-CM | POA: Diagnosis not present

## 2024-04-21 DIAGNOSIS — E119 Type 2 diabetes mellitus without complications: Secondary | ICD-10-CM | POA: Diagnosis not present

## 2024-04-21 DIAGNOSIS — M79674 Pain in right toe(s): Secondary | ICD-10-CM

## 2024-04-21 DIAGNOSIS — N1831 Chronic kidney disease, stage 3a: Secondary | ICD-10-CM | POA: Diagnosis not present

## 2024-04-21 DIAGNOSIS — M79675 Pain in left toe(s): Secondary | ICD-10-CM

## 2024-04-21 NOTE — Progress Notes (Signed)
 This patient returns to my office for at risk foot care.  This patient requires this care by a professional since this patient will be at risk due to having diabetes.  This patient is unable to cut nails  himself since the patient cannot reach his nails.These nails are painful walking and wearing shoes.  He has history of injuring his right big toe two months ago.This patient presents for at risk foot care today.  General Appearance  Alert, conversant and in no acute stress.  Vascular  Dorsalis pedis and posterior tibial  pulses are palpable  bilaterally.  Capillary return is within normal limits  bilaterally. Temperature is within normal limits  bilaterally.  Neurologic  Senn-Weinstein monofilament wire test diminished   bilaterally. Muscle power within normal limits bilaterally.  Nails Thick disfigured discolored nails with subungual debris  from hallux to fifth toes bilaterally. No evidence of bacterial infection or drainage bilaterally.  Orthopedic  No limitations of motion  feet .  No crepitus or effusions noted.  No bony pathology or digital deformities noted.  HAV  B/L.  Hammer toes 2-5  B/l. Limited ROM IPJ right hallux  Skin  normotropic skin with no porokeratosis noted bilaterally.  No signs of infections or ulcers noted.  Callus sub 1st MPJ  and sub 3 right foot symptomatic  Onychomycosis  Pain in right toes  Pain in left toes    Consent was obtained for treatment procedures.   Mechanical debridement of nails 1-5  bilaterally performed with a nail nipper.  Filed with dremel without incident.   Return office visit    3 months                 Told patient to return for periodic foot care and evaluation due to potential at risk complications.   Ruffin Cotton DPM

## 2024-07-25 ENCOUNTER — Ambulatory Visit: Admitting: Podiatry
